# Patient Record
Sex: Male | Born: 1960 | Race: Black or African American | Hispanic: No | Marital: Married | Smoking: Current every day smoker
Health system: Southern US, Community
[De-identification: ages and names within clinical notes are randomized; demographics above are authoritative.]

## PROBLEM LIST (undated history)

## (undated) DIAGNOSIS — E785 Hyperlipidemia, unspecified: Secondary | ICD-10-CM

## (undated) DIAGNOSIS — I10 Essential (primary) hypertension: Secondary | ICD-10-CM

## (undated) HISTORY — DX: Essential (primary) hypertension: I10

## (undated) HISTORY — DX: Hyperlipidemia, unspecified: E78.5

---

## 2014-11-10 LAB — PSA: PSA: 0.56

## 2014-11-11 LAB — HEMOGLOBIN A1C: Hemoglobin A1C: 6.3

## 2017-02-01 LAB — CBC AND DIFFERENTIAL
HEMATOCRIT: 43 (ref 41–53)
HEMOGLOBIN: 13.9 (ref 13.5–17.5)
Neutrophils Absolute: 3
PLATELETS: 273 (ref 150–399)
WBC: 6.6

## 2017-02-01 LAB — BASIC METABOLIC PANEL
BUN: 24 — AB (ref 4–21)
Creatinine: 1.3 (ref <=1.3)
GLUCOSE: 109
POTASSIUM: 4.5 (ref 3.4–5.3)
Sodium: 140 (ref 137–147)

## 2017-02-01 LAB — HEPATIC FUNCTION PANEL
ALK PHOS: 69 (ref 25–125)
ALT: 31 (ref 10–40)
AST: 24 (ref 14–40)
Bilirubin, Total: 0.8

## 2017-02-01 LAB — LIPID PANEL
CHOLESTEROL: 179 (ref 0–200)
HDL: 44 (ref 35–70)
LDL Cholesterol: 113
Triglycerides: 112 (ref 40–160)

## 2017-02-06 LAB — CBC AND DIFFERENTIAL
HCT: 42 (ref 41–53)
Hemoglobin: 13.5 (ref 13.5–17.5)
Neutrophils Absolute: 2
Platelets: 256 (ref 150–399)
WBC: 4.8

## 2017-02-06 LAB — IRON AND TIBC
Iron Bind.Cap.(Total): 297
Iron: 69

## 2017-02-06 LAB — FERRITIN: FERRITIN: 47.3

## 2017-02-06 LAB — TSH: TSH: 0.42 (ref <=5.90)

## 2017-09-03 NOTE — Progress Notes (Addendum)
Subjective:    Patient ID: Mario Newman, male    DOB: 1960-12-05, 56 y.o.   MRN: [id number]  HPI:  Mario Newman is here to establish as a new pt.  He is very pleasant 56 year old male. PMH: HTN, Depression, glucose intolerance (A1c been running in low 6s, never been on anti-diabetic medications), and Tobacco Use.  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  He drinks 5 cups of coffee each morning and a Mnt Dew in the afternoon, he denies any plain water intake.  He eats only one meal a day-dinner and denies any exercise.  He smokes 3/4 pack/day and has been using tobacco > 20 years (with 3 yr hiatus, he started smoking again when his wife's healthy began to fail >2 yrs ago).  He denies any acute sx's, however would like to lose weight and start a regular exercise regime.  He is compliant on Lisinopril 10mg  daily and Citalopram 40mg  and denies med SE. He reports "decent sleep" with early waking several times/night.   Patient Care Team    Relationship Specialty Notifications Start End  Esaw Grandchild, NP PCP - General Family Medicine  08/06/17     Patient Active Problem List   Diagnosis Date Noted  . Essential hypertension 09/04/2017  . Healthcare maintenance 09/04/2017  . Tobacco use 09/04/2017  . Depression, recurrent ([place]) 09/04/2017     History reviewed. No pertinent past medical history.   History reviewed. No pertinent surgical history.   Family History  Problem Relation Age of Onset  . Hypertension Mother   . Diabetes Father   . Alcohol abuse Father   . Healthy Sister      Social History   Substance and Sexual Activity  Drug Use No     Social History   Substance and Sexual Activity  Alcohol Use No  . Frequency: Never     Social History   Tobacco Use  Smoking Status Current Every Day Smoker  . Packs/day: 1.00  . Years: 20.00  . Pack years: 20.00  . Types: Cigarettes  Smokeless  Tobacco Never Used     Outpatient Encounter Medications as of 09/04/2017  Medication Sig  . citalopram (CELEXA) 40 MG tablet Take 1 tablet by mouth daily.  Marland Kitchen lisinopril (PRINIVIL,ZESTRIL) 10 MG tablet Take 1 tablet by mouth daily.   No facility-administered encounter medications on file as of 09/04/2017.     Allergies: Patient has no known allergies.  Body mass index is 31.76 kg/m.  Blood pressure 108/72, pulse 72, height 5' 7.25" (1.708 m), weight 204 lb 4.8 oz (92.7 kg).      Review of Systems  Constitutional: Positive for fatigue. Negative for activity change, appetite change, chills, diaphoresis, fever and unexpected weight change.  Eyes: Negative for visual disturbance.  Respiratory: Negative for cough, chest tightness, shortness of breath, wheezing and stridor.   Cardiovascular: Negative for chest pain, palpitations and leg swelling.  Gastrointestinal: Negative for abdominal distention, abdominal pain, blood in stool, constipation, diarrhea, nausea and vomiting.  Endocrine: Negative for cold intolerance, heat intolerance, polydipsia, polyphagia and polyuria.  Genitourinary: Negative for difficulty urinating and flank pain.  Musculoskeletal: Negative for arthralgias, back pain, gait problem, joint swelling, myalgias, neck pain and neck stiffness.  Skin: Negative for color change, pallor, rash and wound.  Neurological: Negative for dizziness and headaches.  Hematological: Does not bruise/bleed easily.  Psychiatric/Behavioral: Positive for dysphoric mood and  sleep disturbance. Negative for decreased concentration, hallucinations, self-injury and suicidal ideas. The patient is not nervous/anxious and is not hyperactive.        Objective:   Physical Exam  Constitutional: He is oriented to person, place, and time. He appears well-developed and well-nourished. No distress.  HENT:  Head: Normocephalic and atraumatic.  Right Ear: External ear normal.  Left Ear: External ear  normal.  Cardiovascular: Normal rate, regular rhythm, normal heart sounds and intact distal pulses.  No murmur heard. Pulmonary/Chest: Effort normal and breath sounds normal. No respiratory distress. He has no wheezes. He has no rales. He exhibits no tenderness.  Neurological: He is alert and oriented to person, place, and time. Coordination normal.  Skin: Skin is warm and dry. No rash noted. He is not diaphoretic. No erythema. No pallor.  Psychiatric: He has a normal mood and affect. His behavior is normal. Judgment and thought content normal.  Nursing note and vitals reviewed.         Assessment & Plan:   1. Essential hypertension   2. Healthcare maintenance   3. Tobacco use   4. Depression, recurrent ([place])     Healthcare maintenance  Increase water intake, strive for at least 100 ounces/day.   Follow Heart Healthy diet Increase regular exercise.  Recommend at least 30 minutes daily, 5 days per week of walking, jogging, biking, swimming, YouTube/Pinterest workout videos. Please schedule complete physical with fasting labs in 2 months.  Essential hypertension BP at goal 108/72, HR 72 Continue daily Lisinopril 10mg  Reduce-stop tobacco use   Tobacco use Declined smoking cessation   Depression, recurrent (HCC) Stable on once daily Citalopram 40mg    FOLLOW-UP:  Return in about 2 months (around 11/04/2017) for CPE, Fasting Lab Draw.

## 2017-09-04 ENCOUNTER — Ambulatory Visit: Payer: Managed Care, Other (non HMO) | Admitting: Adult Health

## 2017-09-04 ENCOUNTER — Encounter: Payer: Self-pay | Admitting: Adult Health

## 2017-09-04 VITALS — BP 108/72 | HR 72 | Ht 67.25 in | Wt 204.3 lb

## 2017-09-04 DIAGNOSIS — R5383 Other fatigue: Secondary | ICD-10-CM | POA: Diagnosis not present

## 2017-09-04 DIAGNOSIS — Z0000 Encounter for general adult medical examination without abnormal findings: Secondary | ICD-10-CM | POA: Insufficient documentation

## 2017-09-04 DIAGNOSIS — I10 Essential (primary) hypertension: Secondary | ICD-10-CM | POA: Insufficient documentation

## 2017-09-04 DIAGNOSIS — R7302 Impaired glucose tolerance (oral): Secondary | ICD-10-CM | POA: Diagnosis not present

## 2017-09-04 DIAGNOSIS — Z720 Tobacco use: Secondary | ICD-10-CM

## 2017-09-04 DIAGNOSIS — F339 Major depressive disorder, recurrent, unspecified: Secondary | ICD-10-CM | POA: Diagnosis not present

## 2017-09-04 NOTE — Assessment & Plan Note (Signed)
Declined smoking cessation

## 2017-09-04 NOTE — Assessment & Plan Note (Signed)
BP at goal 108/72, HR 72 Continue daily Lisinopril 10mg  Reduce-stop tobacco use

## 2017-09-04 NOTE — Patient Instructions (Signed)
Heart-Healthy Eating Plan Many factors influence your heart health, including eating and exercise habits. Heart (coronary) risk increases with abnormal blood fat (lipid) levels. Heart-healthy meal planning includes limiting unhealthy fats, increasing healthy fats, and making other small dietary changes. This includes maintaining a healthy body weight to help keep lipid levels within a normal range. What is my plan? Your health care provider recommends that you:  Get no more than __25___% of the total calories in your daily diet from fat.  Limit your intake of saturated fat to less than __5____% of your total calories each day.  Limit the amount of cholesterol in your diet to less than __300__ mg per day.  What types of fat should I choose?  Choose healthy fats more often. Choose monounsaturated and polyunsaturated fats, such as olive oil and canola oil, flaxseeds, walnuts, almonds, and seeds.  Eat more omega-3 fats. Good choices include salmon, mackerel, sardines, tuna, flaxseed oil, and ground flaxseeds. Aim to eat fish at least two times each week.  Limit saturated fats. Saturated fats are primarily found in animal products, such as meats, butter, and cream. Plant sources of saturated fats include palm oil, palm kernel oil, and coconut oil.  Avoid foods with partially hydrogenated oils in them. These contain trans fats. Examples of foods that contain trans fats are stick margarine, some tub margarines, cookies, crackers, and other baked goods. What general guidelines do I need to follow?  Check food labels carefully to identify foods with trans fats or high amounts of saturated fat.  Fill one half of your plate with vegetables and green salads. Eat 4-5 servings of vegetables per day. A serving of vegetables equals 1 cup of raw leafy vegetables,  cup of raw or cooked cut-up vegetables, or  cup of vegetable juice.  Fill one fourth of your plate with whole grains. Look for the word "whole"  as the first word in the ingredient list.  Fill one fourth of your plate with lean protein foods.  Eat 4-5 servings of fruit per day. A serving of fruit equals one medium whole fruit,  cup of dried fruit,  cup of fresh, frozen, or canned fruit, or  cup of 100% fruit juice.  Eat more foods that contain soluble fiber. Examples of foods that contain this type of fiber are apples, broccoli, carrots, beans, peas, and barley. Aim to get 20-30 g of fiber per day.  Eat more home-cooked food and less restaurant, buffet, and fast food.  Limit or avoid alcohol.  Limit foods that are high in starch and sugar.  Avoid fried foods.  Cook foods by using methods other than frying. Baking, boiling, grilling, and broiling are all great options. Other fat-reducing suggestions include: ? Removing the skin from poultry. ? Removing all visible fats from meats. ? Skimming the fat off of stews, soups, and gravies before serving them. ? Steaming vegetables in water or broth.  Lose weight if you are overweight. Losing just 5-10% of your initial body weight can help your overall health and prevent diseases such as diabetes and heart disease.  Increase your consumption of nuts, legumes, and seeds to 4-5 servings per week. One serving of dried beans or legumes equals  cup after being cooked, one serving of nuts equals 1 ounces, and one serving of seeds equals  ounce or 1 tablespoon.  You may need to monitor your salt (sodium) intake, especially if you have high blood pressure. Talk with your health care provider or dietitian to get  more information about reducing sodium. What foods can I eat? Grains  Breads, including [place], white, pita, wheat, raisin, rye, oatmeal, and [place]. Tortillas that are neither fried nor made with lard or trans fat. Low-fat rolls, including hotdog and hamburger buns and English muffins. Biscuits. Muffins. Waffles. Pancakes. Light popcorn. Whole-grain cereals. Flatbread. Melba  toast. Pretzels. Breadsticks. Rusks. Low-fat snacks and crackers, including oyster, saltine, matzo, graham, animal, and rye. Rice and pasta, including brown rice and those that are made with whole wheat. Vegetables All vegetables. Fruits All fruits, but limit coconut. Meats and Other Protein Sources Lean, well-trimmed beef, veal, pork, and lamb. Chicken and [place] without skin. All fish and shellfish. Wild duck, rabbit, pheasant, and venison. Egg whites or low-cholesterol egg substitutes. Dried beans, peas, lentils, and tofu.Seeds and most nuts. Dairy Low-fat or nonfat cheeses, including ricotta, string, and mozzarella. Skim or 1% milk that is liquid, powdered, or evaporated. Buttermilk that is made with low-fat milk. Nonfat or low-fat yogurt. Beverages Mineral water. Diet carbonated beverages. Sweets and Desserts Sherbets and fruit ices. Honey, jam, marmalade, jelly, and syrups. Meringues and gelatins. Pure sugar candy, such as hard candy, jelly beans, gumdrops, mints, marshmallows, and small amounts of dark chocolate. [employer] Eat all sweets and desserts in moderation. Fats and Oils Nonhydrogenated (trans-free) margarines. Vegetable oils, including soybean, sesame, sunflower, olive, peanut, safflower, corn, canola, and cottonseed. Salad dressings or mayonnaise that are made with a vegetable oil. Limit added fats and oils that you use for cooking, baking, salads, and as spreads. Other Cocoa powder. Coffee and tea. All seasonings and condiments. The items listed above may not be a complete list of recommended foods or beverages. Contact your dietitian for more options. What foods are not recommended? Grains Breads that are made with saturated or trans fats, oils, or whole milk. Croissants. Butter rolls. Cheese breads. Sweet rolls. Donuts. Buttered popcorn. Chow mein noodles. High-fat crackers, such as cheese or butter crackers. Meats and Other Protein Sources Fatty meats, such as  hotdogs, short ribs, sausage, spareribs, bacon, ribeye roast or steak, and mutton. High-fat deli meats, such as salami and bologna. Caviar. Domestic duck and goose. Organ meats, such as kidney, liver, sweetbreads, brains, gizzard, chitterlings, and heart. Dairy Cream, sour cream, cream cheese, and creamed cottage cheese. Whole milk cheeses, including blue (bleu), Monterey Jack, [place], [place], American, [place], Swiss, [place], [place], and [place]. Whole or 2% milk that is liquid, evaporated, or condensed. Whole buttermilk. Cream sauce or high-fat cheese sauce. Yogurt that is made from whole milk. Beverages Regular sodas and drinks with added sugar. Sweets and Desserts Frosting. Pudding. Cookies. Cakes other than angel food cake. Candy that has milk chocolate or white chocolate, hydrogenated fat, butter, coconut, or unknown ingredients. Buttered syrups. Full-fat ice cream or ice cream drinks. Fats and Oils Gravy that has suet, meat fat, or shortening. Cocoa butter, hydrogenated oils, palm oil, coconut oil, palm kernel oil. These can often be found in baked products, candy, fried foods, nondairy creamers, and whipped toppings. Solid fats and shortenings, including bacon fat, salt pork, lard, and butter. Nondairy cream substitutes, such as coffee creamers and sour cream substitutes. Salad dressings that are made of unknown oils, cheese, or sour cream. The items listed above may not be a complete list of foods and beverages to avoid. Contact your dietitian for more information. This information is not intended to replace advice given to you by your health care provider. Make sure you discuss any questions you have with your health care  provider. Document Released: 07/04/2008 Document Revised: 04/14/2016 Document Reviewed: 03/19/2014 Elsevier Interactive Patient Education  2017 [employer].   Exercising to [employer] Exercising can help you to lose weight. In order to lose weight through exercise,  you need to do vigorous-intensity exercise. You can tell that you are exercising with vigorous intensity if you are breathing very hard and fast and cannot hold a conversation while exercising. Moderate-intensity exercise helps to maintain your current weight. You can tell that you are exercising at a moderate level if you have a higher heart rate and faster breathing, but you are still able to hold a conversation. How often should I exercise? Choose an activity that you enjoy and set realistic goals. Your health care provider can help you to make an activity plan that works for you. Exercise regularly as directed by your health care provider. This may include:  Doing resistance training twice each week, such as: ? Push-ups. ? Sit-ups. ? Lifting weights. ? Using resistance bands.  Doing a given intensity of exercise for a given amount of time. Choose from these options: ? 150 minutes of moderate-intensity exercise every week. ? 75 minutes of vigorous-intensity exercise every week. ? A mix of moderate-intensity and vigorous-intensity exercise every week.  Children, pregnant women, people who are out of shape, people who are overweight, and older adults may need to consult a health care provider for individual recommendations. If you have any sort of medical condition, be sure to consult your health care provider before starting a new exercise program. What are some activities that can help me to lose weight?  Walking at a rate of at least 4.5 miles an hour.  Jogging or running at a rate of 5 miles per hour.  Biking at a rate of at least 10 miles per hour.  Lap swimming.  Roller-skating or in-line skating.  Cross-country skiing.  Vigorous competitive sports, such as football, basketball, and soccer.  Jumping rope.  Aerobic dancing. How can I be more active in my day-to-day activities?  Use the stairs instead of the elevator.  Take a walk during your lunch break.  If you drive,  park your car farther away from work or school.  If you take public transportation, get off one stop early and walk the rest of the way.  Make all of your phone calls while standing up and walking around.  Get up, stretch, and walk around every 30 minutes throughout the day. What guidelines should I follow while exercising?  Do not exercise so much that you hurt yourself, feel dizzy, or get very short of breath.  Consult your health care provider prior to starting a new exercise program.  Wear comfortable clothes and shoes with good support.  Drink plenty of water while you exercise to prevent dehydration or heat stroke. Body water is lost during exercise and must be replaced.  Work out until you breathe faster and your heart beats faster. This information is not intended to replace advice given to you by your health care provider. Make sure you discuss any questions you have with your health care provider. Document Released: 10/28/2010 Document Revised: 03/02/2016 Document Reviewed: 02/26/2014 Elsevier Interactive Patient Education  2018 [employer].  Increase water intake, strive for at least 100 ounces/day.   Follow Heart Healthy diet Increase regular exercise.  Recommend at least 30 minutes daily, 5 days per week of walking, jogging, biking, swimming, YouTube/Pinterest workout videos. Please schedule complete physical with fasting labs in 2 months. WELCOME  TO THE PRACTICE!

## 2017-09-04 NOTE — Assessment & Plan Note (Signed)
  Increase water intake, strive for at least 100 ounces/day.   Follow Heart Healthy diet Increase regular exercise.  Recommend at least 30 minutes daily, 5 days per week of walking, jogging, biking, swimming, YouTube/Pinterest workout videos. Please schedule complete physical with fasting labs in 2 months.

## 2017-09-04 NOTE — Assessment & Plan Note (Signed)
Stable on once daily Citalopram 40mg 

## 2017-09-05 NOTE — Addendum Note (Signed)
Addended by: Mina Marble D on: 09/05/2017 03:55 PM   Modules accepted: Orders

## 2017-09-24 ENCOUNTER — Other Ambulatory Visit: Payer: Self-pay | Admitting: Adult Health

## 2017-09-24 MED ORDER — LISINOPRIL 10 MG PO TABS: 10.0000 mg | ORAL_TABLET | Freq: Every day | ORAL | 0 refills | Status: DC

## 2017-09-24 NOTE — Telephone Encounter (Signed)
We have not prescribed these medications for the patient previously.  Please review and refill if appropriate.  T. Mekia Dipinto, CMA  

## 2017-09-24 NOTE — Telephone Encounter (Signed)
°   Patient called states he is out of: Rx last prescribed by PCP in [place] (patient recently moved to [facility]) & chose Valetta Fuller D as his new PCP.  lisinopril (PRINIVIL,ZESTRIL) 10 MG tablet [[id number]]  Order Details  Dose: 1 tablet Route: Oral Frequency: Daily  Dispense Quantity: -- Refills: -- Fills remaining: --        Sig: Take 1 tablet by mouth daily.     ----- Patient wishes Rx to be sent to [facility] on [address].  --glh

## 2017-10-08 ENCOUNTER — Ambulatory Visit (INDEPENDENT_AMBULATORY_CARE_PROVIDER_SITE_OTHER): Payer: Managed Care, Other (non HMO)

## 2017-10-08 VITALS — BP 112/76 | HR 70 | Temp 98.5°F

## 2017-10-08 DIAGNOSIS — Z23 Encounter for immunization: Secondary | ICD-10-CM

## 2017-11-06 NOTE — Progress Notes (Signed)
Subjective:    Patient ID: Mario Newman, male    DOB: [birth year], 57 y.o.   MRN: [id number]  HPI: 09/04/17 OV:  Mario Newman is here to establish as a new pt.  He is very pleasant 57 year old male. PMH: HTN, Depression, glucose intolerance (A1c been running in low 6s, never been on anti-diabetic medications), and Tobacco Use.  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  He drinks 5 cups of coffee each morning and a Mnt Dew in the afternoon, he denies any plain water intake.  He eats only one meal a day-dinner and denies any exercise.  He smokes 3/4 pack/day and has been using tobacco > 20 years (with 3 yr hiatus, he started smoking again when his wife's healthy began to fail >2 yrs ago).  He denies any acute sx's, however would like to lose weight and start a regular exercise regime.  He is compliant on Lisinopril 10mg  daily and Citalopram 40mg  and denies med SE. He reports "decent sleep" with early waking several times/night.    11/08/17 OV: Mario Newman is here for CPE. Healthcare Maintenance: Colonoscopy-  Patient Care Team    Relationship Specialty Notifications Start End  Esaw Grandchild, NP PCP - General Family Medicine  08/06/17     Patient Active Problem List   Diagnosis Date Noted  . Essential hypertension 09/04/2017  . Healthcare maintenance 09/04/2017  . Tobacco use 09/04/2017  . Depression, recurrent ([place]) 09/04/2017     No past medical history on file.   No past surgical history on file.   Family History  Problem Relation Age of Onset  . Hypertension Mother   . Diabetes Father   . Alcohol abuse Father   . Healthy Sister      Social History   Substance and Sexual Activity  Drug Use No     Social History   Substance and Sexual Activity  Alcohol Use No  . Frequency: Never     Social History   Tobacco Use  Smoking Status Current Every Day Smoker  . Packs/day: 1.00  . Years:  20.00  . Pack years: 20.00  . Types: Cigarettes  Smokeless Tobacco Never Used     Outpatient Encounter Medications as of 11/08/2017  Medication Sig  . citalopram (CELEXA) 40 MG tablet Take 1 tablet by mouth daily.  Marland Kitchen lisinopril (PRINIVIL,ZESTRIL) 10 MG tablet Take 1 tablet (10 mg total) by mouth daily.   No facility-administered encounter medications on file as of 11/08/2017.     Allergies: Patient has no known allergies.  There is no height or weight on file to calculate BMI.  There were no vitals taken for this visit.      Review of Systems  Constitutional: Positive for fatigue. Negative for activity change, appetite change, chills, diaphoresis, fever and unexpected weight change.  Eyes: Negative for visual disturbance.  Respiratory: Negative for cough, chest tightness, shortness of breath, wheezing and stridor.   Cardiovascular: Negative for chest pain, palpitations and leg swelling.  Gastrointestinal: Negative for abdominal distention, abdominal pain, blood in stool, constipation, diarrhea, nausea and vomiting.  Endocrine: Negative for cold intolerance, heat intolerance, polydipsia, polyphagia and polyuria.  Genitourinary: Negative for difficulty urinating and flank pain.  Musculoskeletal: Negative for arthralgias, back pain, gait problem, joint swelling, myalgias, neck pain and neck stiffness.  Skin: Negative for color change, pallor, rash and wound.  Neurological: Negative for dizziness and headaches.  Hematological: Does not bruise/bleed easily.  Psychiatric/Behavioral: Positive for dysphoric mood and sleep disturbance. Negative for decreased concentration, hallucinations, self-injury and suicidal ideas. The patient is not nervous/anxious and is not hyperactive.        Objective:   Physical Exam  Constitutional: He is oriented to person, place, and time. He appears well-developed and well-nourished. No distress.  HENT:  Head: Normocephalic and atraumatic.  Right  Ear: External ear normal.  Left Ear: External ear normal.  Cardiovascular: Normal rate, regular rhythm, normal heart sounds and intact distal pulses.  No murmur heard. Pulmonary/Chest: Effort normal and breath sounds normal. No respiratory distress. He has no wheezes. He has no rales. He exhibits no tenderness.  Neurological: He is alert and oriented to person, place, and time. Coordination normal.  Skin: Skin is warm and dry. No rash noted. He is not diaphoretic. No erythema. No pallor.  Psychiatric: He has a normal mood and affect. His behavior is normal. Judgment and thought content normal.  Nursing note and vitals reviewed.         Assessment & Plan:   No diagnosis found.  No problem-specific Assessment & Plan notes found for this encounter.  FOLLOW-UP:  No Follow-up on file.

## 2017-11-08 ENCOUNTER — Other Ambulatory Visit (INDEPENDENT_AMBULATORY_CARE_PROVIDER_SITE_OTHER): Payer: Managed Care, Other (non HMO)

## 2017-11-08 DIAGNOSIS — R7302 Impaired glucose tolerance (oral): Secondary | ICD-10-CM

## 2017-11-08 DIAGNOSIS — Z0000 Encounter for general adult medical examination without abnormal findings: Secondary | ICD-10-CM

## 2017-11-08 DIAGNOSIS — R5383 Other fatigue: Secondary | ICD-10-CM | POA: Diagnosis not present

## 2017-11-09 LAB — COMPREHENSIVE METABOLIC PANEL
ALBUMIN: 4.2 g/dL (ref 3.5–5.5)
ALT: 23 IU/L (ref 0–44)
AST: 20 IU/L (ref 0–40)
Albumin/Globulin Ratio: 1.6 (ref 1.2–2.2)
Alkaline Phosphatase: 67 IU/L (ref 39–117)
BUN / CREAT RATIO: 10 (ref 9–20)
BUN: 12 mg/dL (ref 6–24)
Bilirubin Total: <0.2 mg/dL (ref 0.0–1.2)
CALCIUM: 9.2 mg/dL (ref 8.7–10.2)
CO2: 21 mmol/L (ref 20–29)
CREATININE: 1.22 mg/dL (ref 0.76–1.27)
Chloride: 106 mmol/L (ref 96–106)
GFR calc Af Amer: 76 mL/min/{1.73_m2} (ref >59)
GFR, EST NON AFRICAN AMERICAN: 66 mL/min/{1.73_m2} (ref >59)
GLOBULIN, TOTAL: 2.7 g/dL (ref 1.5–4.5)
Glucose: 109 mg/dL — ABNORMAL HIGH (ref 65–99)
Potassium: 4.8 mmol/L (ref 3.5–5.2)
Sodium: 140 mmol/L (ref 134–144)
TOTAL PROTEIN: 6.9 g/dL (ref 6.0–8.5)

## 2017-11-09 LAB — HEMOGLOBIN A1C
Est. average glucose Bld gHb Est-mCnc: 131 mg/dL
Hgb A1c MFr Bld: 6.2 % — ABNORMAL HIGH (ref 4.8–5.6)

## 2017-11-09 LAB — LIPID PANEL
Chol/HDL Ratio: 4.8 ratio (ref 0.0–5.0)
Cholesterol, Total: 176 mg/dL (ref 100–199)
HDL: 37 mg/dL — ABNORMAL LOW (ref >39)
LDL Calculated: 123 mg/dL — ABNORMAL HIGH (ref 0–99)
Triglycerides: 78 mg/dL (ref 0–149)
VLDL Cholesterol Cal: 16 mg/dL (ref 5–40)

## 2017-11-09 LAB — VITAMIN D 25 HYDROXY (VIT D DEFICIENCY, FRACTURES): Vit D, 25-Hydroxy: 45.2 ng/mL (ref 30.0–100.0)

## 2017-11-09 LAB — TSH: TSH: 0.67 u[IU]/mL (ref 0.450–4.500)

## 2017-11-15 NOTE — Progress Notes (Signed)
Subjective:    Patient ID: Mario Newman, male    DOB: 15-Oct-1960, 57 y.o.   MRN: [id number]  HPI: 09/04/17 OV:  [name] is here to establish as a new pt.  He is very pleasant 57 year old male. PMH: HTN, Depression, glucose intolerance (A1c been running in low 6s, never been on anti-diabetic medications), and Tobacco Use.  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  He drinks 5 cups of coffee each morning and a Mnt Dew in the afternoon, he denies any plain water intake.  He eats only one meal a day-dinner and denies any exercise.  He smokes 3/4 pack/day and has been using tobacco > 20 years (with 3 yr hiatus, he started smoking again when his wife's healthy began to fail >2 yrs ago).  He denies any acute sx's, however would like to lose weight and start a regular exercise regime.  He is compliant on Lisinopril 10mg  daily and Citalopram 40mg  and denies med SE. He reports "decent sleep" with early waking several times/night.    11/20/17 OV: Mr. Geise presents for CPE. The 10-year ASCVD risk score [name] DC [name]., et al., 2013) is: 26.2%   Values used to calculate the score:     Age: 57 years     Sex: Male     Is Non-Hispanic African American: Yes     Diabetic: No     Tobacco smoker: Yes     Systolic Blood Pressure: 846 mmHg     Is BP treated: Yes     HDL Cholesterol: 37 mg/dL     Total Cholesterol: 176 mg/dL  Recommended to start atorvastatin 20mg  nightly He continues to consume >8 cups coffee/day and smoking 2/3 pack/day, but open to cessation assistance. He feels that his depression is stable, denies thoughts of harming himself/others, declined CBT today. He has yet to start an exercise program, but is interested in stretching and push-up type based exercise. He is changing employers in 3 weeks, which will double his income and he is very excited about the move.  Healthcare Maintenance: Colonoscopy-completed  2018 Immunizations- declined today LDCT- 17 pack hx and is currently smoking pack/day , therefore not indicated   Patient Care Team    Relationship Specialty Notifications Start End  Esaw Grandchild, NP PCP - General Family Medicine  08/06/17     Patient Active Problem List   Diagnosis Date Noted  . Essential hypertension 09/04/2017  . Healthcare maintenance 09/04/2017  . Tobacco use 09/04/2017  . Depression, recurrent ([place]) 09/04/2017     History reviewed. No pertinent past medical history.   History reviewed. No pertinent surgical history.   Family History  Problem Relation Age of Onset  . Hypertension Mother   . Diabetes Father   . Alcohol abuse Father   . Healthy Sister      Social History   Substance and Sexual Activity  Drug Use No     Social History   Substance and Sexual Activity  Alcohol Use No  . Frequency: Never     Social History   Tobacco Use  Smoking Status Current Every Day Smoker  . Packs/day: 1.00  . Years: 20.00  . Pack years: 20.00  . Types: Cigarettes  Smokeless Tobacco Never Used     Outpatient Encounter Medications as of 11/20/2017  Medication Sig  . citalopram (CELEXA) 40 MG tablet Take 1 tablet by mouth daily.  Marland Kitchen  lisinopril (PRINIVIL,ZESTRIL) 10 MG tablet Take 1 tablet (10 mg total) by mouth daily.  Marland Kitchen atorvastatin (LIPITOR) 20 MG tablet Take 1 tablet (20 mg total) by mouth daily.  Marland Kitchen buPROPion (WELLBUTRIN SR) 150 MG 12 hr tablet First three days take one tab daily, then increase to twice daily. Set quit date in 1-2 weeks   No facility-administered encounter medications on file as of 11/20/2017.     Allergies: Patient has no known allergies.  Body mass index is 32.5 kg/m.  Blood pressure (!) 151/87, pulse 77, height 5' 7.24" (1.708 m), weight 209 lb (94.8 kg).  Review of Systems  Constitutional: Positive for fatigue. Negative for activity change, appetite change, chills, diaphoresis, fever and unexpected weight  change.  Eyes: Negative for visual disturbance.  Respiratory: Negative for cough, chest tightness, shortness of breath, wheezing and stridor.   Cardiovascular: Negative for chest pain, palpitations and leg swelling.  Gastrointestinal: Negative for abdominal distention, abdominal pain, blood in stool, constipation, diarrhea, nausea and vomiting.  Endocrine: Negative for cold intolerance, heat intolerance, polydipsia, polyphagia and polyuria.  Genitourinary: Negative for difficulty urinating and flank pain.  Musculoskeletal: Positive for myalgias. Negative for arthralgias, back pain, gait problem, joint swelling, neck pain and neck stiffness.  Skin: Negative for color change, pallor, rash and wound.  Neurological: Negative for dizziness and headaches.  Hematological: Does not bruise/bleed easily.  Psychiatric/Behavioral: Positive for dysphoric mood and sleep disturbance. Negative for decreased concentration, hallucinations, self-injury and suicidal ideas. The patient is not nervous/anxious and is not hyperactive.        Objective:   Physical Exam  Constitutional: He is oriented to person, place, and time. He appears well-developed and well-nourished. No distress.  HENT:  Head: Normocephalic and atraumatic.  Right Ear: Hearing, tympanic membrane, external ear and ear canal normal. Tympanic membrane is not erythematous and not bulging. No decreased hearing is noted.  Left Ear: Hearing, tympanic membrane, external ear and ear canal normal. Tympanic membrane is not erythematous and not bulging. No decreased hearing is noted.  Nose: Nose normal. Right sinus exhibits no maxillary sinus tenderness and no frontal sinus tenderness. Left sinus exhibits no maxillary sinus tenderness and no frontal sinus tenderness.  Mouth/Throat: Uvula is midline, oropharynx is clear and moist and mucous membranes are normal.  Eyes: Conjunctivae are normal. Pupils are equal, round, and reactive to light.  Neck: Normal  range of motion. Neck supple.  Cardiovascular: Normal rate, regular rhythm, normal heart sounds and intact distal pulses.  No murmur heard. Pulmonary/Chest: Effort normal and breath sounds normal. No respiratory distress. He has no wheezes. He has no rales. He exhibits no tenderness.  Abdominal: Soft. Bowel sounds are normal. He exhibits no distension and no mass. There is no tenderness. There is no rebound and no guarding.  Musculoskeletal: He exhibits tenderness.       Thoracic back: He exhibits tenderness and spasm.  Lymphadenopathy:    He has no cervical adenopathy.  Neurological: He is alert and oriented to person, place, and time. Coordination normal.  Skin: Skin is warm and dry. No rash noted. He is not diaphoretic. No erythema. No pallor.  Psychiatric: He has a normal mood and affect. His behavior is normal. Judgment and thought content normal.  Nursing note and vitals reviewed.         Assessment & Plan:   1. Tinnitus of both ears   2. Nocturia   3. Healthcare maintenance   4. Tobacco use   5. Depression, recurrent ([place])  6. Essential hypertension     Healthcare maintenance Please take all medications as directed. Increase water intake, strive for at least 100 ounces/day.   Follow Heart Healthy diet Increase regular exercise.  Recommend at least 30 minutes daily, 5 days per week of walking, jogging, biking, swimming, YouTube/Pinterest workout videos. Reduce caffeine intake. Set smoking quit date 1-2 weeks Urology and Audiology referrals placed. Return in 6 weeks for labs (liver function) and immunizations Return in 3 months for fasting labs and office visit.  Tobacco use Started on Wellbutrin 150mg  daily x 3 days then increase to BID Set quit date 1-2 weeks He denies thoughts of harming himself/others  Depression, recurrent ([place]) Stable, he denies thoughts of harming himself/others Taking Citalopram 40mg  QD Declined CBT  Essential hypertension BP above  goal 151/87  FOLLOW-UP:  Return in about 3 months (around 02/17/2018) for Regular Follow Up, HTN, Hypercholestermia, Fasting Labs.

## 2017-11-20 ENCOUNTER — Encounter: Payer: Self-pay | Admitting: Adult Health

## 2017-11-20 ENCOUNTER — Ambulatory Visit (INDEPENDENT_AMBULATORY_CARE_PROVIDER_SITE_OTHER): Payer: Managed Care, Other (non HMO) | Admitting: Adult Health

## 2017-11-20 VITALS — BP 151/87 | HR 77 | Ht 67.24 in | Wt 209.0 lb

## 2017-11-20 DIAGNOSIS — Z0000 Encounter for general adult medical examination without abnormal findings: Secondary | ICD-10-CM

## 2017-11-20 DIAGNOSIS — Z720 Tobacco use: Secondary | ICD-10-CM

## 2017-11-20 DIAGNOSIS — S39012A Strain of muscle, fascia and tendon of lower back, initial encounter: Secondary | ICD-10-CM | POA: Diagnosis not present

## 2017-11-20 DIAGNOSIS — R351 Nocturia: Secondary | ICD-10-CM | POA: Diagnosis not present

## 2017-11-20 DIAGNOSIS — H9313 Tinnitus, bilateral: Secondary | ICD-10-CM | POA: Diagnosis not present

## 2017-11-20 DIAGNOSIS — I10 Essential (primary) hypertension: Secondary | ICD-10-CM | POA: Diagnosis not present

## 2017-11-20 DIAGNOSIS — E785 Hyperlipidemia, unspecified: Secondary | ICD-10-CM | POA: Insufficient documentation

## 2017-11-20 DIAGNOSIS — F339 Major depressive disorder, recurrent, unspecified: Secondary | ICD-10-CM | POA: Diagnosis not present

## 2017-11-20 DIAGNOSIS — E7800 Pure hypercholesterolemia, unspecified: Secondary | ICD-10-CM

## 2017-11-20 MED ORDER — BUPROPION HCL ER (SR) 150 MG PO TB12: ORAL_TABLET | ORAL | 0 refills | Status: DC

## 2017-11-20 MED ORDER — ATORVASTATIN CALCIUM 20 MG PO TABS: 20.0000 mg | ORAL_TABLET | Freq: Every day | ORAL | 1 refills | Status: DC

## 2017-11-20 MED ORDER — CYCLOBENZAPRINE HCL 10 MG PO TABS: 10.0000 mg | ORAL_TABLET | Freq: Every day | ORAL | 0 refills | Status: DC

## 2017-11-20 NOTE — Assessment & Plan Note (Addendum)
BP above goal 151/87, HR 77 Currently on Lisinopril 10mg  QD He consumed "pot of coffee" and 6-8 cigarettes prior to OV. Advised to quit smoking and consume <2 cups of coffee/day

## 2017-11-20 NOTE — Assessment & Plan Note (Signed)
Stretching, heating pad, nightly Cyclobenzaprine PRN

## 2017-11-20 NOTE — Assessment & Plan Note (Signed)
Audiology referral placed.

## 2017-11-20 NOTE — Assessment & Plan Note (Signed)
Stable, he denies thoughts of harming himself/others Taking Citalopram 40mg  QD Declined CBT

## 2017-11-20 NOTE — Patient Instructions (Signed)
Heart-Healthy Eating Plan Many factors influence your heart health, including eating and exercise habits. Heart (coronary) risk increases with abnormal blood fat (lipid) levels. Heart-healthy meal planning includes limiting unhealthy fats, increasing healthy fats, and making other small dietary changes. This includes maintaining a healthy body weight to help keep lipid levels within a normal range. What is my plan? Your health care provider recommends that you:  Get no more than __25__% of the total calories in your daily diet from fat.  Limit your intake of saturated fat to less than __5__% of your total calories each day.  Limit the amount of cholesterol in your diet to less than __300__ mg per day.  What types of fat should I choose?  Choose healthy fats more often. Choose monounsaturated and polyunsaturated fats, such as olive oil and canola oil, flaxseeds, walnuts, almonds, and seeds.  Eat more omega-3 fats. Good choices include salmon, mackerel, sardines, tuna, flaxseed oil, and ground flaxseeds. Aim to eat fish at least two times each week.  Limit saturated fats. Saturated fats are primarily found in animal products, such as meats, butter, and cream. Plant sources of saturated fats include palm oil, palm kernel oil, and coconut oil.  Avoid foods with partially hydrogenated oils in them. These contain trans fats. Examples of foods that contain trans fats are stick margarine, some tub margarines, cookies, crackers, and other baked goods. What general guidelines do I need to follow?  Check food labels carefully to identify foods with trans fats or high amounts of saturated fat.  Fill one half of your plate with vegetables and green salads. Eat 4-5 servings of vegetables per day. A serving of vegetables equals 1 cup of raw leafy vegetables,  cup of raw or cooked cut-up vegetables, or  cup of vegetable juice.  Fill one fourth of your plate with whole grains. Look for the word "whole" as  the first word in the ingredient list.  Fill one fourth of your plate with lean protein foods.  Eat 4-5 servings of fruit per day. A serving of fruit equals one medium whole fruit,  cup of dried fruit,  cup of fresh, frozen, or canned fruit, or  cup of 100% fruit juice.  Eat more foods that contain soluble fiber. Examples of foods that contain this type of fiber are apples, broccoli, carrots, beans, peas, and barley. Aim to get 20-30 g of fiber per day.  Eat more home-cooked food and less restaurant, buffet, and fast food.  Limit or avoid alcohol.  Limit foods that are high in starch and sugar.  Avoid fried foods.  Cook foods by using methods other than frying. Baking, boiling, grilling, and broiling are all great options. Other fat-reducing suggestions include: ? Removing the skin from poultry. ? Removing all visible fats from meats. ? Skimming the fat off of stews, soups, and gravies before serving them. ? Steaming vegetables in water or broth.  Lose weight if you are overweight. Losing just 5-10% of your initial body weight can help your overall health and prevent diseases such as diabetes and heart disease.  Increase your consumption of nuts, legumes, and seeds to 4-5 servings per week. One serving of dried beans or legumes equals  cup after being cooked, one serving of nuts equals 1 ounces, and one serving of seeds equals  ounce or 1 tablespoon.  You may need to monitor your salt (sodium) intake, especially if you have high blood pressure. Talk with your health care provider or dietitian to get  more information about reducing sodium. What foods can I eat? Grains  Breads, including [place], white, pita, wheat, raisin, rye, oatmeal, and [place]. Tortillas that are neither fried nor made with lard or trans fat. Low-fat rolls, including hotdog and hamburger buns and English muffins. Biscuits. Muffins. Waffles. Pancakes. Light popcorn. Whole-grain cereals. Flatbread. Melba toast.  Pretzels. Breadsticks. Rusks. Low-fat snacks and crackers, including oyster, saltine, matzo, graham, animal, and rye. Rice and pasta, including brown rice and those that are made with whole wheat. Vegetables All vegetables. Fruits All fruits, but limit coconut. Meats and Other Protein Sources Lean, well-trimmed beef, veal, pork, and lamb. Chicken and [place] without skin. All fish and shellfish. Wild duck, rabbit, pheasant, and venison. Egg whites or low-cholesterol egg substitutes. Dried beans, peas, lentils, and tofu.Seeds and most nuts. Dairy Low-fat or nonfat cheeses, including ricotta, string, and mozzarella. Skim or 1% milk that is liquid, powdered, or evaporated. Buttermilk that is made with low-fat milk. Nonfat or low-fat yogurt. Beverages Mineral water. Diet carbonated beverages. Sweets and Desserts Sherbets and fruit ices. Honey, jam, marmalade, jelly, and syrups. Meringues and gelatins. Pure sugar candy, such as hard candy, jelly beans, gumdrops, mints, marshmallows, and small amounts of dark chocolate. [employer] Eat all sweets and desserts in moderation. Fats and Oils Nonhydrogenated (trans-free) margarines. Vegetable oils, including soybean, sesame, sunflower, olive, peanut, safflower, corn, canola, and cottonseed. Salad dressings or mayonnaise that are made with a vegetable oil. Limit added fats and oils that you use for cooking, baking, salads, and as spreads. Other Cocoa powder. Coffee and tea. All seasonings and condiments. The items listed above may not be a complete list of recommended foods or beverages. Contact your dietitian for more options. What foods are not recommended? Grains Breads that are made with saturated or trans fats, oils, or whole milk. Croissants. Butter rolls. Cheese breads. Sweet rolls. Donuts. Buttered popcorn. Chow mein noodles. High-fat crackers, such as cheese or butter crackers. Meats and Other Protein Sources Fatty meats, such as hotdogs,  short ribs, sausage, spareribs, bacon, ribeye roast or steak, and mutton. High-fat deli meats, such as salami and bologna. Caviar. Domestic duck and goose. Organ meats, such as kidney, liver, sweetbreads, brains, gizzard, chitterlings, and heart. Dairy Cream, sour cream, cream cheese, and creamed cottage cheese. Whole milk cheeses, including blue (bleu), Monterey Jack, [place], [place], American, [place], Swiss, [place], [place], and [place]. Whole or 2% milk that is liquid, evaporated, or condensed. Whole buttermilk. Cream sauce or high-fat cheese sauce. Yogurt that is made from whole milk. Beverages Regular sodas and drinks with added sugar. Sweets and Desserts Frosting. Pudding. Cookies. Cakes other than angel food cake. Candy that has milk chocolate or white chocolate, hydrogenated fat, butter, coconut, or unknown ingredients. Buttered syrups. Full-fat ice cream or ice cream drinks. Fats and Oils Gravy that has suet, meat fat, or shortening. Cocoa butter, hydrogenated oils, palm oil, coconut oil, palm kernel oil. These can often be found in baked products, candy, fried foods, nondairy creamers, and whipped toppings. Solid fats and shortenings, including bacon fat, salt pork, lard, and butter. Nondairy cream substitutes, such as coffee creamers and sour cream substitutes. Salad dressings that are made of unknown oils, cheese, or sour cream. The items listed above may not be a complete list of foods and beverages to avoid. Contact your dietitian for more information. This information is not intended to replace advice given to you by your health care provider. Make sure you discuss any questions you have with your health care  provider. Document Released: 07/04/2008 Document Revised: 04/14/2016 Document Reviewed: 03/19/2014 Elsevier Interactive Patient Education  2018 [employer].  Bupropion sustained-release tablets (smoking cessation) What is this medicine? BUPROPION (byoo PROE pee on) is used to  help people quit smoking. This medicine may be used for other purposes; ask your health care provider or pharmacist if you have questions. COMMON BRAND NAME(S): Buproban, Zyban What should I tell my health care provider before I take this medicine? They need to know if you have any of these conditions: -an eating disorder, such as anorexia or bulimia -bipolar disorder or psychosis -diabetes or high blood sugar, treated with medication -glaucoma -head injury or brain tumor -heart disease, previous heart attack, or irregular heart beat -high blood pressure -kidney or liver disease -seizures -suicidal thoughts or a previous suicide attempt -Tourette's syndrome -weight loss -an unusual or allergic reaction to bupropion, other medicines, foods, dyes, or preservatives -breast-feeding -pregnant or trying to become pregnant How should I use this medicine? Take this medicine by mouth with a glass of water. Follow the directions on the prescription label. You can take it with or without food. If it upsets your stomach, take it with food. Do not cut, crush or chew this medicine. Take your medicine at regular intervals. If you take this medicine more than once a day, take your second dose at least 8 hours after you take your first dose. To limit difficulty in sleeping, avoid taking this medicine at bedtime. Do not take your medicine more often than directed. Do not stop taking this medicine suddenly except upon the advice of your doctor. Stopping this medicine too quickly may cause serious side effects. A special MedGuide will be given to you by the pharmacist with each prescription and refill. Be sure to read this information carefully each time. Talk to your pediatrician regarding the use of this medicine in children. Special care may be needed. Overdosage: If you think you have taken too much of this medicine contact a poison control center or emergency room at once. NOTE: This medicine is only for  you. Do not share this medicine with others. What if I miss a dose? If you miss a dose, skip the missed dose and take your next tablet at the regular time. There should be at least 8 hours between doses. Do not take double or extra doses. What may interact with this medicine? Do not take this medicine with any of the following medications: -linezolid -MAOIs like Azilect, Carbex, Eldepryl, Marplan, Nardil, and Parnate -methylene blue (injected into a vein) -other medicines that contain bupropion like Wellbutrin This medicine may also interact with the following medications: -alcohol -certain medicines for anxiety or sleep -certain medicines for blood pressure like metoprolol, propranolol -certain medicines for depression or psychotic disturbances -certain medicines for HIV or AIDS like efavirenz, lopinavir, nelfinavir, ritonavir -certain medicines for irregular heart beat like propafenone, flecainide -certain medicines for Parkinson's disease like amantadine, levodopa -certain medicines for seizures like carbamazepine, phenytoin, phenobarbital -cimetidine -clopidogrel -cyclophosphamide -digoxin -furazolidone -isoniazid -nicotine -orphenadrine -procarbazine -steroid medicines like prednisone or cortisone -stimulant medicines for attention disorders, weight loss, or to stay awake -tamoxifen -theophylline -thiotepa -ticlopidine -tramadol -warfarin This list may not describe all possible interactions. Give your health care provider a list of all the medicines, herbs, non-prescription drugs, or dietary supplements you use. Also tell them if you smoke, drink alcohol, or use illegal drugs. Some items may interact with your medicine. What should I watch for while using this medicine? Visit your  doctor or health care professional for regular checks on your progress. This medicine should be used together with a patient support program. It is important to participate in a behavioral program,  counseling, or other support program that is recommended by your health care professional. Patients and their families should watch out for new or worsening thoughts of suicide or depression. Also watch out for sudden changes in feelings such as feeling anxious, agitated, panicky, irritable, hostile, aggressive, impulsive, severely restless, overly excited and hyperactive, or not being able to sleep. If this happens, especially at the beginning of treatment or after a change in dose, call your health care professional. Avoid alcoholic drinks while taking this medicine. Drinking excessive alcoholic beverages, using sleeping or anxiety medicines, or quickly stopping the use of these agents while taking this medicine may increase your risk for a seizure. Do not drive or use heavy machinery until you know how this medicine affects you. This medicine can impair your ability to perform these tasks. Do not take this medicine close to bedtime. It may prevent you from sleeping. Your mouth may get dry. Chewing sugarless gum or sucking hard candy, and drinking plenty of water may help. Contact your doctor if the problem does not go away or is severe. Do not use nicotine patches or chewing gum without the advice of your doctor or health care professional while taking this medicine. You may need to have your blood pressure taken regularly if your doctor recommends that you use both nicotine and this medicine together. What side effects may I notice from receiving this medicine? Side effects that you should report to your doctor or health care professional as soon as possible: -allergic reactions like skin rash, itching or hives, swelling of the face, lips, or tongue -breathing problems -changes in vision -confusion -elevated mood, decreased need for sleep, racing thoughts, impulsive behavior -fast or irregular heartbeat -hallucinations, loss of contact with reality -increased blood pressure -redness, blistering,  peeling or loosening of the skin, including inside the mouth -seizures -suicidal thoughts or other mood changes -unusually weak or tired -vomiting Side effects that usually do not require medical attention (report to your doctor or health care professional if they continue or are bothersome): -constipation -headache -loss of appetite -nausea -tremors -weight loss This list may not describe all possible side effects. Call your doctor for medical advice about side effects. You may report side effects to FDA at 1-800-FDA-1088. Where should I keep my medicine? Keep out of the reach of children. Store at room temperature between 20 and 25 degrees C (68 and 77 degrees F). Protect from light. Keep container tightly closed. Throw away any unused medicine after the expiration date. NOTE: This sheet is a summary. It may not cover all possible information. If you have questions about this medicine, talk to your doctor, pharmacist, or health care provider.  2018 Elsevier/Gold Standard (2016-03-17 13:49:28)  Please take all medications as directed. Increase water intake, strive for at least 100 ounces/day.   Follow Heart Healthy diet Increase regular exercise.  Recommend at least 30 minutes daily, 5 days per week of walking, jogging, biking, swimming, YouTube/Pinterest workout videos. Reduce caffeine intake. Set smoking quit date 1-2 weeks Urology and Audiology referrals placed. Return in 6 weeks for labs (liver function) and immunizations Return in 3 months for fasting labs and office visit. NICE TO SEE YOU!

## 2017-11-20 NOTE — Assessment & Plan Note (Signed)
Started on Wellbutrin 150mg  daily x 3 days then increase to BID Set quit date 1-2 weeks He denies thoughts of harming himself/others

## 2017-11-20 NOTE — Assessment & Plan Note (Signed)
Urology referral placed

## 2017-11-20 NOTE — Assessment & Plan Note (Signed)
Please take all medications as directed. Increase water intake, strive for at least 100 ounces/day.   Follow Heart Healthy diet Increase regular exercise.  Recommend at least 30 minutes daily, 5 days per week of walking, jogging, biking, swimming, YouTube/Pinterest workout videos. Reduce caffeine intake. Set smoking quit date 1-2 weeks Urology and Audiology referrals placed. Return in 6 weeks for labs (liver function) and immunizations Return in 3 months for fasting labs and office visit.

## 2017-11-20 NOTE — Assessment & Plan Note (Signed)
The 10-year ASCVD risk score [name] DC [name]., et al., 2013) is: 26.2%   Values used to calculate the score:     Age: 57 years     Sex: Male     Is Non-Hispanic African American: Yes     Diabetic: No     Tobacco smoker: Yes     Systolic Blood Pressure: 111 mmHg     Is BP treated: Yes     HDL Cholesterol: 37 mg/dL     Total Cholesterol: 176 mg/dL   LFTs normal Started on Atorvastatin 20mg  nightly LFT re-check 6 weeks, lipids 3 months

## 2017-12-31 ENCOUNTER — Other Ambulatory Visit: Payer: Managed Care, Other (non HMO)

## 2017-12-31 ENCOUNTER — Ambulatory Visit: Payer: Managed Care, Other (non HMO) | Admitting: Audiology

## 2018-02-15 ENCOUNTER — Other Ambulatory Visit: Payer: Self-pay | Admitting: Adult Health

## 2018-02-18 ENCOUNTER — Encounter: Payer: Self-pay | Admitting: Adult Health

## 2018-02-18 ENCOUNTER — Ambulatory Visit: Payer: Managed Care, Other (non HMO) | Admitting: Adult Health

## 2018-02-18 ENCOUNTER — Ambulatory Visit: Payer: Managed Care, Other (non HMO)

## 2018-02-18 VITALS — BP 111/74 | HR 81 | Ht 67.25 in | Wt 207.2 lb

## 2018-02-18 DIAGNOSIS — R079 Chest pain, unspecified: Secondary | ICD-10-CM

## 2018-02-18 DIAGNOSIS — I10 Essential (primary) hypertension: Secondary | ICD-10-CM | POA: Diagnosis not present

## 2018-02-18 DIAGNOSIS — R0600 Dyspnea, unspecified: Secondary | ICD-10-CM | POA: Diagnosis not present

## 2018-02-18 DIAGNOSIS — Z720 Tobacco use: Secondary | ICD-10-CM

## 2018-02-18 NOTE — Assessment & Plan Note (Signed)
CXR IMPRESSION: Chronic bronchitic-smoking related changes. No alveolar pneumonia nor CHF.

## 2018-02-18 NOTE — Progress Notes (Signed)
Subjective:    Patient ID: Mario Newman, male    DOB: 07/16/1961, 57 y.o.   MRN: [id number]  HPI:  Mario Newman presents with L trapezius pain that has been intermittent for 6 months that increased in severity and with new onset radiation to L hand the last 3 weeks. He descibes L trapezius pain as a "dull ache", rated 4/10 and has been occurring more frequently the last 2-3 days. He described L elbow "as a sharp pain" rate, 4/10 He also reports numbness of L fingers- 3/4/5 He denies recent neck injury but reports "sleeping funny" due to sinus congestion r/t seasonal allergies He reports poor sleep, with frequent waking and his wife reports "loud snoring with the occasional snort that will wake him". 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 He continue to smoke 5 cigarettes/day, has been using tobacco 25-30 years with a 3 yr quite period. He recently changed jobs in early March is under much more vocational pressure He reports sig fatigue for years He wakes at 0500 every am and consumes pot of coffee throughout the am He also reports increase noticeable increase in dyspnea over the last 6 months. He reports this is noticeable when walking up a flight of stairs ( He has EKG- NSR His wife is BS during OV  Patient Care Team    Relationship Specialty Notifications Start End  Esaw Grandchild, NP PCP - General Family Medicine  08/06/17     Patient Active Problem List   Diagnosis Date Noted  . Chest pain 02/18/2018  . Dyspnea 02/18/2018  . Nocturia 11/20/2017  . 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 Strain of back 11/20/2017  . Elevated LDL cholesterol level 11/20/2017  . Essential hypertension 09/04/2017  . Healthcare maintenance  09/04/2017  . Tobacco use 09/04/2017  . Depression, recurrent ([place]) 09/04/2017     History reviewed. No pertinent past medical history.   History reviewed. No pertinent surgical history.   Family History  Problem Relation Age of Onset  . Hypertension Mother   . Diabetes Father   . Alcohol abuse Father   . Healthy Sister      Social History   Substance and Sexual Activity  Drug Use No     Social History   Substance and Sexual Activity  Alcohol Use No  . Frequency: Never     Social History   Tobacco Use  Smoking Status Current Every Day Smoker  . Packs/day: 1.00  . Years: 20.00  . Pack years: 20.00  . Types: Cigarettes  Smokeless Tobacco Never Used     Outpatient Encounter Medications as of 02/18/2018  Medication Sig  . atorvastatin (LIPITOR) 20 MG tablet Take 1 tablet (20 mg total) by mouth daily.  Marland Kitchen buPROPion (WELLBUTRIN SR) 150 MG 12 hr tablet One tablet twice daily. NO FURTHER REFILLS.  PATIENT NEEDS OV  . citalopram (CELEXA) 40 MG tablet Take 1 tablet by mouth daily.  Marland Kitchen lisinopril (PRINIVIL,ZESTRIL) 10 MG tablet Take 1 tablet (10 mg total) by mouth daily.  . tamsulosin (FLOMAX) 0.4 MG CAPS capsule Take 11 capsules by mouth daily.  . [DISCONTINUED] cyclobenzaprine (FLEXERIL) 10 MG tablet Take 1 tablet (10 mg total) by mouth at bedtime.   No facility-administered encounter medications on file  as of 02/18/2018.     Allergies: Patient has no known allergies.  Body mass index is 32.21 kg/m.  Blood pressure 111/74, pulse 81, height 5' 7.25" (1.708 m), weight 207 lb 3.2 oz (94 kg), SpO2 95 %.  Review of Systems  Constitutional: Positive for activity change and fatigue. Negative for appetite change, chills, diaphoresis, fever and unexpected weight change.  HENT: Positive for congestion.   Respiratory: Positive for shortness of breath. Negative for cough, chest tightness, wheezing and stridor.   Cardiovascular: Negative for chest pain, palpitations and  leg swelling.  Neurological: Negative for dizziness and headaches.  Hematological: Does not bruise/bleed easily.  Psychiatric/Behavioral: Positive for sleep disturbance. The patient is nervous/anxious.       Objective:   Physical Exam  Constitutional: He is oriented to person, place, and time. He appears well-developed and well-nourished.  Non-toxic appearance. He does not appear ill. He appears distressed.  HENT:  Head: Normocephalic and atraumatic.  Eyes: Pupils are equal, round, and reactive to light. EOM are normal.  Neck: Normal range of motion. Neck supple.  Cardiovascular: Normal rate, intact distal pulses and normal pulses.  No extrasystoles are present.  No murmur heard. Pulmonary/Chest: Effort normal and breath sounds normal. He has no decreased breath sounds. He has no wheezes. He has no rales.  Neurological: He is alert and oriented to person, place, and time.  Psychiatric: His behavior is normal. His mood appears anxious. He is not agitated.      Assessment & Plan:   1. Chest pain, unspecified type   2. Dyspnea, unspecified type   3. Essential hypertension   4. Tobacco use     Chest pain EKG- NSR with anteroseptal infarct, age indetermined. No sig changes noted since 2009 EKG that is in system STAT ECHO ordered Imperative that he stop tobacco use and follow heart healthy diet He denies CP/Chest pressure He denies first degree family hx of CAD/MI He has been using tobacco >25 yrs   Essential hypertension BP at goal  111/74 HR 81 Continue on Lisinopril 10mg  QD   Tobacco use He declines smoking cessation Imperative that he stop all tobacco use  Dyspnea CXR IMPRESSION: Chronic bronchitic-smoking related changes. No alveolar pneumonia nor CHF.   FOLLOW-UP:  Return in about 3 months (around 05/21/2018) for Regular Follow Up.

## 2018-02-18 NOTE — Assessment & Plan Note (Signed)
He declines smoking cessation Imperative that he stop all tobacco use

## 2018-02-18 NOTE — Assessment & Plan Note (Signed)
BP at goal  111/74 HR 81 Continue on Lisinopril 10mg  QD

## 2018-02-18 NOTE — Patient Instructions (Signed)
Shortness of Breath, Adult Shortness of breath means you have trouble breathing. Your lungs are organs for breathing. Follow these instructions at home: Pay attention to any changes in your symptoms. Take these actions to help with your condition:  Do not smoke. Smoking can cause shortness of breath. If you need help to quit smoking, ask your doctor.  Avoid things that can make it harder to breathe, such as: ? Mold. ? Dust. ? Air pollution. ? Chemical smells. ? Things that can cause allergy symptoms (allergens), if you have allergies.  Keep your living space clean and free of mold and dust.  Rest as needed. Slowly return to your usual activities.  Take over-the-counter and prescription medicines, including oxygen and inhaled medicines, only as told by your doctor.  Keep all follow-up visits as told by your doctor. This is important.  Contact a doctor if:  Your condition does not get better as soon as expected.  You have a hard time doing your normal activities, even after you rest.  You have new symptoms. Get help right away if:  You have trouble breathing when you are resting.  You feel light-headed or you faint.  You have a cough that is not helped by medicines.  You cough up blood.  You have pain with breathing.  You have pain in your chest, arms, shoulders, or belly (abdomen).  You have a fever.  You cannot walk up stairs.  You cannot exercise the way you normally do. This information is not intended to replace advice given to you by your health care provider. Make sure you discuss any questions you have with your health care provider. Document Released: 03/13/2008 Document Revised: 10/12/2016 Document Reviewed: 10/12/2016 Elsevier Interactive Patient Education  2017 [place].  EKG- Stable Will order Stress Test, do not exert yourself until test completed. Increase plain water intake, follow heart healthy diet, and stop tobacco use. If Red Flag Symptoms  develop, ie chest pain, chest pressure, nausea, profuse sweating, significant shortness of breath- seek immediate medical care. Also recommend Sleep Study this summer. Follow-up in 4 weeks. FEEL BETTER!

## 2018-02-18 NOTE — Assessment & Plan Note (Addendum)
EKG- NSR with anteroseptal infarct, age indetermined. No sig changes noted since 2009 EKG that is in system STAT ECHO ordered Imperative that he stop tobacco use and follow heart healthy diet He denies CP/Chest pressure He denies first degree family hx of CAD/MI He has been using tobacco >25 yrs

## 2018-02-21 ENCOUNTER — Ambulatory Visit (HOSPITAL_COMMUNITY): Payer: Managed Care, Other (non HMO)

## 2018-02-21 ENCOUNTER — Telehealth (HOSPITAL_COMMUNITY): Payer: Self-pay

## 2018-02-21 ENCOUNTER — Ambulatory Visit (HOSPITAL_COMMUNITY): Payer: Managed Care, Other (non HMO) | Attending: Cardiovascular Disease

## 2018-02-21 DIAGNOSIS — I10 Essential (primary) hypertension: Secondary | ICD-10-CM | POA: Insufficient documentation

## 2018-02-21 DIAGNOSIS — R0600 Dyspnea, unspecified: Secondary | ICD-10-CM | POA: Insufficient documentation

## 2018-02-21 DIAGNOSIS — Z720 Tobacco use: Secondary | ICD-10-CM | POA: Diagnosis not present

## 2018-02-21 DIAGNOSIS — R079 Chest pain, unspecified: Secondary | ICD-10-CM | POA: Insufficient documentation

## 2018-02-21 MED ORDER — SODIUM CHLORIDE 0.9 % IV SOLN
40.0000 ug/kg/min | Freq: Once | INTRAVENOUS | Status: AC
  Administered 2018-02-21: 3760 ug/min via INTRAVENOUS

## 2018-02-21 NOTE — Telephone Encounter (Signed)
Attempted to contact [employer] again without success. Requesting to see if he is going to make his appointment today for his Dobutamine Stress ECHO. Asked him to call us back. S.Rowyn Spilde EMTP.

## 2018-02-21 NOTE — Telephone Encounter (Signed)
Attempted to contact Lynden Oxford without success, an answering machine message was left on his cell phone. Requested for him to call us back for further instructions. S.An Lannan EMTP

## 2018-03-17 ENCOUNTER — Other Ambulatory Visit: Payer: Self-pay | Admitting: Adult Health

## 2018-03-25 ENCOUNTER — Other Ambulatory Visit: Payer: Self-pay | Admitting: Adult Health

## 2018-03-26 ENCOUNTER — Ambulatory Visit: Payer: Managed Care, Other (non HMO) | Admitting: Adult Health

## 2018-04-01 ENCOUNTER — Ambulatory Visit: Payer: Managed Care, Other (non HMO) | Attending: Adult Health | Admitting: Audiology

## 2018-04-01 DIAGNOSIS — H9193 Unspecified hearing loss, bilateral: Secondary | ICD-10-CM

## 2018-04-01 DIAGNOSIS — H93299 Other abnormal auditory perceptions, unspecified ear: Secondary | ICD-10-CM

## 2018-04-01 DIAGNOSIS — H9313 Tinnitus, bilateral: Secondary | ICD-10-CM | POA: Diagnosis not present

## 2018-04-01 DIAGNOSIS — H906 Mixed conductive and sensorineural hearing loss, bilateral: Secondary | ICD-10-CM | POA: Diagnosis present

## 2018-04-01 NOTE — Procedures (Signed)
[facility] [place]  [place], [place] [address]  [id number]   Audiological Evaluation  Patient Name: Mario Newman   Status: Outpatient   DOB: April 04, 1961    Diagnosis: Tinnitus bilateral MRN: [id number] Date:  04/01/2018     Referent: Esaw Grandchild, NP  History: Mario Newman was seen for an audiological evaluation.  Primary Concern: Continuous ringng in ears for past 3-4 years with difficulty hearing - frequently asking, "please repeat".  Pain: None History of hearing problems: Y / N History of ear infections:  Y / N History of ear surgery or "tubes" : N History of dizziness/vertigo:   Y / N History of balance issues:  Y / N Tinnitus: On a scale of 1 (no problem) -10 (can't stand it) Mario Newman rates his tinnitus as "7". Sounds like a frequency - with one sound.  Sound sensitivity: N but finds himself "turning the volume down when he needs to concentrate".  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  History of hypertension: Y - "borderline but under control with medication" History of diabetes:  N Family history of hearing loss:  N Other concerns: N   Evaluation: Conventional pure tone audiometry from 250Hz  - 8000Hz  with using insert earphones.  Hearing Thresholds are mixed by bone conduction bilaterally. Air conduction hearing thresholds are: Right ear:  Thresholds of 30 dBHL at 250Hz ; 15-20 dBHL from 500Hz  - 2000Hz ; 25 dbHL at 3000hz ; 35 dBHL at 4000Hz  and 40-50dBHL from 6000Hz -8000Hz .  Left ear:    Thresholds of  10-20 dBHL from 250Hz  - 2000Hz ; 40 dBHL at 4000Hz  and 50-60 dBHL from 6000Hz  - 8000Hz . Reliability is good Speech reception levels (repeating words near threshold) using recorded spondee word lists:  Right ear: 15 dBHL.  Left ear:  20dBHL Word recognition (at comfortably loud volumes) using recorded NU-6 word lists at 55  dBHL, in quiet.  Right ear: 100%.  Left ear:   100% Word recognition using PBK word lists in minimal multitalker background noise:  +5 dBHL  Right ear: 76%                              Left ear:  50%  Tympanometry (middle ear function) shows normal middle ear volume, pressure and compliance bilaterally (Type A). Right ipsilateral acoustic reflexes are absent but the left acoustic reflexes were not obtained since a seal could not be maintained.    Tinnitus matching appeared to be approximately 3000Hz  using narrow band noise at 32dBHL and speech noise at 30 dBHL. The tinnitus did not appear to suppress.  CONCLUSION:      Mario Newman needs referral to an ENT. He has a mixed hearing loss bilaterally and reports that he feels "pressure" changes in his sinuses and ears when laying on one side of his head for a while and then turning to lay on the other side. In addition Mario Newman has bilateral tinnitus at approximately 3000Hz  at 30 dBHL bilaterally.    Mario Newman has normal hearing in the low frequencies that slopes in the high frequencies to a mild to moderate mixed hearing loss on the right and a mild to moderately severe mixed hearing loss on the left. He has slight recruitment bilaterally, more sensitive on the left side.  This amount of hearing loss would adversely affect speech communication at normal  conversational speech levels.   Word recognition is excellent in each ear  in quiet at conversational speech levels bilaterally. In minimal background noise, word recognition drops to fair on the right and poor on the left.  It is expected that Mario Newman will miss 25-50% of what is said in most social and office situations, possibly more with fluctuating background noise.      Of concern is that the tinnitus is very bothersome to Newman who rate is as a "7" on a scale of "1-10" (10 being severe). He reports that he hears the tinnitus all of the time and that it keeps him up at night.  The app  Mario Newman Relax was discussed for immediate use. In addition, Mario Newman needs evaluation by an ENT with a possible hearing aid/tinnitus evaluation. The test results were discussed and Mario Newman counseled. In preparation for the hearing aid evaluation Mario Newman was encouraged to contact his insurance company regarding hearing aid benefits and participating hearing aid providers as well as whether he is eligible for [facility] benefits.  RECOMMENDATIONS: 1.   Referral to an Ear, Nose and Throat physician for evaluation of a) bilateral tinnitus b) mixed hearing loss and c) absent acoustic reflexes on the right side (note the left acoustic reflexes could not be obtained today - poor seal). 2.   To minimize the adverse effects of tinnitus 1) avoid quiet  2) use noise maskers at home such as a sound machine, quiet music, a fan or other background noise at a volume just loud enough to mask the high pitched tinnitus. 3) If the tinnitus becomes more bothersome, adversely affecting your sleep or concentration, contact your physician,  seek additional medical help by an ENT for further treatment of your tinnitus. 3.   A hearing aid/tinnitus evaluation. In addition monitor hearing closely with a repeat audiological evaluation in 3-6 months to monitor hearing and tinnitus (earlier if there is any change in hearing or ear pressure).  This may be completed with an audiologist in private practice, at the ENT office or here. 4.  Strategies that help improve hearing include: A) Face the speaker directly. Optimal is having the speakers face well - lit.  Unless amplified, being within 3-6 feet of the speaker will enhance word recognition. B) Avoid having the speaker back-lit as this will minimize the ability to use cues from lip-reading, facial expression and gestures. C)  Word recognition is poorer in background noise. For optimal word recognition, turn off the TV, radio or noisy fan when engaging in conversation. In a  restaurant, try to sit away from noise sources and close to the primary speaker.  D)  Ask for topic clarification from time to time in order to remain in the conversation.  Most people don't mind repeating or clarifying a point when asked.  If needed, explain the difficulty hearing in background noise or hearing loss. 5.   Use hearing protection during noisy activities such as using a weed eater, moving the lawn, shooting, etc.    Musician's plugs, are available from [employer] for music related hearing protection because there is no distortion.  Other hearing protection, such as sponge plugs (available at pharmacies) or earmuffs (available at sporting goods stores or department stores such as Paediatric nurse) are useful for noisy activities and venues.  Mario Newman Spark, Au.D., CCC-A Doctor of Audiology 04/01/2018   cc: Esaw Grandchild, NP

## 2018-04-06 ENCOUNTER — Other Ambulatory Visit: Payer: Self-pay | Admitting: Adult Health

## 2018-04-17 ENCOUNTER — Telehealth: Payer: Self-pay | Admitting: Adult Health

## 2018-04-17 DIAGNOSIS — H9313 Tinnitus, bilateral: Secondary | ICD-10-CM

## 2018-04-17 NOTE — Addendum Note (Signed)
Addended by: Fonnie Mu on: 04/17/2018 03:22 PM   Modules accepted: Orders

## 2018-04-17 NOTE — Telephone Encounter (Signed)
Patient called asking about ENT referral. We sent him to audiology for eval and treatment and they recommended him f/u with an ENT and that PCP office would handle this. If this is true can I please get a referral placed for patient.

## 2018-05-11 ENCOUNTER — Other Ambulatory Visit: Payer: Self-pay | Admitting: Adult Health

## 2018-06-12 NOTE — Progress Notes (Signed)
Subjective:    Patient ID: Mario Newman, male    DOB: 1961/09/07, 57 y.o.   MRN: [id number]  HPI: 06/17/18 OV: Mario Newman is here for regular f/u: HTN, HLD, Depression, and Smoking cessation. He was previously on Bupropion 150mg  BID and was down to 2 cigarettes/day but then stopped rx. He has been off Bupropion for >2 months, currently smoking 10 cigarettes/ay. He has reduced caffeine intake, however rarely drinks plain water. He has bot been exercising but hopes to start a calisthenics regime this fall. He reports stress from work (recently changed jobs a few months ago) and minor anxiety about his son returning to college a few weeks ago. He denies thoughts of harming himself/others and reports a good support system of family and friends. He denies CP/dyspnea/dizziness/HA/palpitations.  Of Note- 02/21/18- Dobutamine Stress Echo: Impressions: This is intrepreted as a negative Dobutamine stress echo. No evidence of ischemia . Normal LV function  Patient Care Team    Relationship Specialty Notifications Start End  Mario Grandchild, NP PCP - General Family Medicine  08/06/17     Patient Active Problem List   Diagnosis Date Noted  . Chest pain 02/18/2018  . Dyspnea 02/18/2018  . Nocturia 11/20/2017  . 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 Strain of back 11/20/2017  . Elevated LDL cholesterol level 11/20/2017  . Essential hypertension 09/04/2017  . Healthcare maintenance 09/04/2017  . Tobacco use 09/04/2017  . Depression, recurrent ([place]) 09/04/2017     History reviewed. No pertinent past medical history.   History reviewed. No pertinent surgical history.   Family History  Problem Relation Age of Onset  . Hypertension Mother   . Diabetes Father   . Alcohol abuse Father   . Healthy Sister      Social History   Substance and Sexual Activity  Drug Use No     Social History   Substance and Sexual Activity  Alcohol Use No  . Frequency: Never     Social  History   Tobacco Use  Smoking Status Current Every Day Smoker  . Packs/day: 1.00  . Years: 20.00  . Pack years: 20.00  . Types: Cigarettes  Smokeless Tobacco Never Used     Outpatient Encounter Medications as of 06/17/2018  Medication Sig  . atorvastatin (LIPITOR) 20 MG tablet Take 1 tablet (20 mg total) by mouth daily. PATIENT MUST HAVE OFFICE VISIT PRIOR TO ANY FURTHER REFILLS  . citalopram (CELEXA) 40 MG tablet Take 1 tablet by mouth daily.  Marland Kitchen lisinopril (PRINIVIL,ZESTRIL) 10 MG tablet TAKE 1 TABLET BY MOUTH WITH BREAKFAST EVERY DAY  . buPROPion (WELLBUTRIN SR) 150 MG 12 hr tablet First three days 1 tab daily, then increase to 1 tab twice daily- hold at this dose.  . [DISCONTINUED] buPROPion (WELLBUTRIN SR) 150 MG 12 hr tablet TAKE ONE TABLET BY MOUTH TWICE DAILY. NO FURTHER REFILLS. PATIENT NEEDS OV (Patient not taking: Reported on 06/17/2018)  . [DISCONTINUED] tamsulosin (FLOMAX) 0.4 MG CAPS capsule Take 11 capsules by mouth daily.   No facility-administered encounter medications on file as of 06/17/2018.     Allergies: Patient has no known allergies.  Body mass index is 32.6 kg/m.  Blood pressure 120/75, pulse 84, height 5' 7.25" (1.708 m), weight 209 lb 11.2 oz (95.1 kg), SpO2 94 %.  Review of Systems  Constitutional: Positive for fatigue. Negative for activity change, appetite change, chills, diaphoresis, fever and unexpected weight change.  Eyes: Negative for visual disturbance.  Respiratory: Negative for cough, chest  tightness, shortness of breath, wheezing and stridor.   Cardiovascular: Negative for chest pain, palpitations and leg swelling.  Gastrointestinal: Negative for abdominal distention, abdominal pain, blood in stool, constipation, diarrhea, nausea and vomiting.  Endocrine: Negative for cold intolerance, heat intolerance, polydipsia, polyphagia and polyuria.  Genitourinary: Negative for difficulty urinating and flank pain.  Musculoskeletal: Positive for myalgias.  Negative for arthralgias, back pain, gait problem, joint swelling, neck pain and neck stiffness.  Skin: Negative for color change, pallor, rash and wound.  Neurological: Negative for dizziness and headaches.  Hematological: Does not bruise/bleed easily.  Psychiatric/Behavioral: Positive for dysphoric mood and sleep disturbance. Negative for decreased concentration, hallucinations, self-injury and suicidal ideas. The patient is not nervous/anxious and is not hyperactive.        Objective:   Physical Exam  Constitutional: He is oriented to person, place, and time. He appears well-developed and well-nourished. No distress.  HENT:  Head: Normocephalic and atraumatic.  Right Ear: Tympanic membrane, external ear and ear canal normal. Decreased hearing is noted.  Left Ear: External ear and ear canal normal. Tympanic membrane is not erythematous. Decreased hearing is noted.  Nose: Nose normal.  Eyes: Pupils are equal, round, and reactive to light. Conjunctivae and EOM are normal.  Cardiovascular: Normal rate, regular rhythm, normal heart sounds and intact distal pulses.  No murmur heard. Pulmonary/Chest: Effort normal and breath sounds normal. No respiratory distress. He has no wheezes. He has no rales. He exhibits no tenderness.  Abdominal: Soft. Bowel sounds are normal. He exhibits no distension and no mass. There is no tenderness. There is no rebound and no guarding.  Musculoskeletal:       Thoracic back: He exhibits no spasm.  Neurological: He is alert and oriented to person, place, and time.  Skin: Skin is warm and dry. No rash noted. He is not diaphoretic. No erythema. No pallor.  Psychiatric: He has a normal mood and affect. His behavior is normal. Judgment and thought content normal.  Nursing note and vitals reviewed.     Assessment & Plan:   1. Essential hypertension   2. Tobacco use   3. Healthcare maintenance     Essential hypertension BP at goal 120/75, HR 84 Continue  Lisinopril 10mg  QD  Healthcare maintenance Continue all medications as directed, re-started on Wellbutrin. Increase water intake, strive for at least75 ounces/day.  Follow Mediterranean diet Increase regular exercise.  Recommend at least 30 minutes daily, 5 days per week of walking, jogging, biking, swimming, YouTube/Pinterest workout videos. Follow-up in 6 months for complete physical, schedule fasting lab appt about a week before the physical.  Tobacco use Continue all medications as directed, re-started on Wellbutrin 150mg .  1 tab QD for 3 days then increase to BID Currently smoking 10 cigarettes/day   FOLLOW-UP:  Return in about 6 months (around 12/16/2018) for CPE, Fasting Labs.

## 2018-06-16 ENCOUNTER — Other Ambulatory Visit: Payer: Self-pay | Admitting: Family Medicine

## 2018-06-17 ENCOUNTER — Encounter: Payer: Self-pay | Admitting: Adult Health

## 2018-06-17 ENCOUNTER — Ambulatory Visit (INDEPENDENT_AMBULATORY_CARE_PROVIDER_SITE_OTHER): Payer: Managed Care, Other (non HMO) | Admitting: Adult Health

## 2018-06-17 VITALS — BP 120/75 | HR 84 | Ht 67.25 in | Wt 209.7 lb

## 2018-06-17 DIAGNOSIS — I10 Essential (primary) hypertension: Secondary | ICD-10-CM | POA: Diagnosis not present

## 2018-06-17 DIAGNOSIS — Z0000 Encounter for general adult medical examination without abnormal findings: Secondary | ICD-10-CM | POA: Diagnosis not present

## 2018-06-17 DIAGNOSIS — Z720 Tobacco use: Secondary | ICD-10-CM | POA: Diagnosis not present

## 2018-06-17 MED ORDER — BUPROPION HCL ER (SR) 150 MG PO TB12: ORAL_TABLET | ORAL | 1 refills | Status: DC

## 2018-06-17 NOTE — Assessment & Plan Note (Signed)
Continue all medications as directed, re-started on Wellbutrin. Increase water intake, strive for at least75 ounces/day.  Follow Mediterranean diet Increase regular exercise.  Recommend at least 30 minutes daily, 5 days per week of walking, jogging, biking, swimming, YouTube/Pinterest workout videos. Follow-up in 6 months for complete physical, schedule fasting lab appt about a week before the physical.

## 2018-06-17 NOTE — Assessment & Plan Note (Signed)
BP at goal 120/75, HR 84 Continue Lisinopril 10mg  QD

## 2018-06-17 NOTE — Assessment & Plan Note (Signed)
Continue all medications as directed, re-started on Wellbutrin 150mg .  1 tab QD for 3 days then increase to BID Currently smoking 10 cigarettes/day

## 2018-06-17 NOTE — Patient Instructions (Addendum)
Mediterranean Diet A Mediterranean diet refers to food and lifestyle choices that are based on the traditions of countries located on the [place]. This way of eating has been shown to help prevent certain conditions and improve outcomes for people who have chronic diseases, like kidney disease and heart disease. What are tips for following this plan? Lifestyle  Cook and eat meals together with your family, when possible.  Drink enough fluid to keep your urine clear or pale yellow.  Be physically active every day. This includes: ? Aerobic exercise like running or swimming. ? Leisure activities like gardening, walking, or housework.  Get 7-8 hours of sleep each night.  If recommended by your health care provider, drink red wine in moderation. This means 1 glass a day for nonpregnant women and 2 glasses a day for men. A glass of wine equals 5 oz (150 mL). Reading food labels  Check the serving size of packaged foods. For foods such as rice and pasta, the serving size refers to the amount of cooked product, not dry.  Check the total fat in packaged foods. Avoid foods that have saturated fat or trans fats.  Check the ingredients list for added sugars, such as corn syrup. Shopping  At the grocery store, buy most of your food from the areas near the walls of the store. This includes: ? Fresh fruits and vegetables (produce). ? Grains, beans, nuts, and seeds. Some of these may be available in unpackaged forms or large amounts (in bulk). ? Fresh seafood. ? Poultry and eggs. ? Low-fat dairy products.  Buy whole ingredients instead of prepackaged foods.  Buy fresh fruits and vegetables in-season from local farmers markets.  Buy frozen fruits and vegetables in resealable bags.  If you do not have access to quality fresh seafood, buy precooked frozen shrimp or canned fish, such as tuna, salmon, or sardines.  Buy small amounts of raw or cooked vegetables, salads, or olives from the  deli or salad bar at your store.  Stock your pantry so you always have certain foods on hand, such as olive oil, canned tuna, canned tomatoes, rice, pasta, and beans. Cooking  Cook foods with extra-virgin olive oil instead of using butter or other vegetable oils.  Have meat as a side dish, and have vegetables or grains as your main dish. This means having meat in small portions or adding small amounts of meat to foods like pasta or stew.  Use beans or vegetables instead of meat in common dishes like chili or lasagna.  Experiment with different cooking methods. Try roasting or broiling vegetables instead of steaming or sauteing them.  Add frozen vegetables to soups, stews, pasta, or rice.  Add nuts or seeds for added healthy fat at each meal. You can add these to yogurt, salads, or vegetable dishes.  Marinate fish or vegetables using olive oil, lemon juice, garlic, and fresh herbs. Meal planning  Plan to eat 1 vegetarian meal one day each week. Try to work up to 2 vegetarian meals, if possible.  Eat seafood 2 or more times a week.  Have healthy snacks readily available, such as: ? Vegetable sticks with hummus. ? Greek yogurt. ? Fruit and nut trail mix.  Eat balanced meals throughout the week. This includes: ? Fruit: 2-3 servings a day ? Vegetables: 4-5 servings a day ? Low-fat dairy: 2 servings a day ? Fish, poultry, or lean meat: 1 serving a day ? Beans and legumes: 2 or more servings a week ? Nuts   and seeds: 1-2 servings a day ? Whole grains: 6-8 servings a day ? Extra-virgin olive oil: 3-4 servings a day  Limit red meat and sweets to only a few servings a month What are my food choices?  Mediterranean diet ? Recommended ? Grains: Whole-grain pasta. Brown rice. Bulgar wheat. Polenta. Couscous. Whole-wheat bread. [place]. ? Vegetables: Artichokes. Beets. Broccoli. Cabbage. Carrots. Eggplant. Green beans. Chard. Kale. Spinach. Onions. Leeks. Peas. Squash.  Tomatoes. Peppers. Radishes. ? Fruits: Apples. Apricots. Avocado. Berries. Bananas. Cherries. Dates. Figs. Grapes. Lemons. Melon. Oranges. Peaches. Plums. Pomegranate. ? Meats and other protein foods: Beans. Almonds. Sunflower seeds. Pine nuts. Peanuts. [place]. Salmon. Scallops. Shrimp. [place]. Tilapia. Clams. Oysters. Eggs. ? Dairy: Low-fat milk. Cheese. Greek yogurt. ? Beverages: Water. Red wine. Herbal tea. ? Fats and oils: Extra virgin olive oil. Avocado oil. Grape seed oil. ? Sweets and desserts: [place] yogurt with honey. Baked apples. Poached pears. Trail mix. ? Seasoning and other foods: Basil. Cilantro. Coriander. Cumin. Mint. Parsley. Sage. Rosemary. Tarragon. Garlic. Oregano. Thyme. Pepper. Balsalmic vinegar. Tahini. Hummus. Tomato sauce. Olives. Mushrooms. ? Limit these ? Grains: Prepackaged pasta or rice dishes. Prepackaged cereal with added sugar. ? Vegetables: Deep fried potatoes (french fries). ? Fruits: Fruit canned in syrup. ? Meats and other protein foods: Beef. Pork. Lamb. Poultry with skin. Hot dogs. [place] Sal[place]. ? Dairy: Ice cream. Sour cream. Whole milk. ? Beverages: Juice. Sugar-sweetened soft drinks. Beer. Liquor and spirits. ? Fats and oils: Butter. Canola oil. Vegetable oil. Beef fat (tallow). Lard. ? Sweets and desserts: Cookies. Cakes. Pies. Candy. ? Seasoning and other foods: Mayonnaise. Premade sauces and marinades. ? The items listed may not be a complete list. Talk with your dietitian about what dietary choices are right for you. Summary  The Mediterranean diet includes both food and lifestyle choices.  Eat a variety of fresh fruits and vegetables, beans, nuts, seeds, and whole grains.  Limit the amount of red meat and sweets that you eat.  Talk with your health care provider about whether it is safe for you to drink red wine in moderation. This means 1 glass a day for nonpregnant women and 2 glasses a day for men. A glass of wine equals 5 oz (150 mL). This information  is not intended to replace advice given to you by your health care provider. Make sure you discuss any questions you have with your health care provider. Document Released: 05/18/2016 Document Revised: 06/20/2016 Document Reviewed: 05/18/2016 Elsevier Interactive Patient Education  2018 [place] all medications as directed, re-started on Wellbutrin. Increase water intake, strive for at least75 ounces/day.   Follow Mediterranean diet Increase regular exercise.  Recommend at least 30 minutes daily, 5 days per week of walking, jogging, biking, swimming, YouTube/Pinterest workout videos. Follow-up in 6 months for complete physical, schedule fasting lab appt about a week before the physical. NICE TO SEE YOU!

## 2018-08-18 ENCOUNTER — Other Ambulatory Visit: Payer: Self-pay | Admitting: Adult Health

## 2018-11-12 ENCOUNTER — Other Ambulatory Visit: Payer: Self-pay | Admitting: Adult Health

## 2018-12-02 ENCOUNTER — Encounter: Payer: Self-pay | Admitting: Adult Health

## 2018-12-03 ENCOUNTER — Other Ambulatory Visit: Payer: Managed Care, Other (non HMO)

## 2018-12-03 ENCOUNTER — Other Ambulatory Visit: Payer: Self-pay | Admitting: Adult Health

## 2018-12-03 ENCOUNTER — Other Ambulatory Visit: Payer: Self-pay

## 2018-12-03 DIAGNOSIS — Z0000 Encounter for general adult medical examination without abnormal findings: Secondary | ICD-10-CM

## 2018-12-03 DIAGNOSIS — E7800 Pure hypercholesterolemia, unspecified: Secondary | ICD-10-CM

## 2018-12-03 DIAGNOSIS — I10 Essential (primary) hypertension: Secondary | ICD-10-CM

## 2018-12-04 LAB — HEMOGLOBIN A1C
Est. average glucose Bld gHb Est-mCnc: 131 mg/dL
HEMOGLOBIN A1C: 6.2 % — AB (ref 4.8–5.6)

## 2018-12-04 LAB — COMPREHENSIVE METABOLIC PANEL
ALT: 33 IU/L (ref 0–44)
AST: 27 IU/L (ref 0–40)
Albumin/Globulin Ratio: 1.8 (ref 1.2–2.2)
Albumin: 4.4 g/dL (ref 3.8–4.9)
Alkaline Phosphatase: 66 IU/L (ref 39–117)
BILIRUBIN TOTAL: 0.3 mg/dL (ref 0.0–1.2)
BUN/Creatinine Ratio: 13 (ref 9–20)
BUN: 15 mg/dL (ref 6–24)
CHLORIDE: 106 mmol/L (ref 96–106)
CO2: 22 mmol/L (ref 20–29)
Calcium: 9.3 mg/dL (ref 8.7–10.2)
Creatinine, Ser: 1.17 mg/dL (ref 0.76–1.27)
GFR calc Af Amer: 80 mL/min/{1.73_m2} (ref >59)
GFR calc non Af Amer: 69 mL/min/{1.73_m2} (ref >59)
Globulin, Total: 2.4 g/dL (ref 1.5–4.5)
Glucose: 105 mg/dL — ABNORMAL HIGH (ref 65–99)
Potassium: 4.9 mmol/L (ref 3.5–5.2)
Sodium: 141 mmol/L (ref 134–144)
Total Protein: 6.8 g/dL (ref 6.0–8.5)

## 2018-12-04 LAB — LIPID PANEL
CHOL/HDL RATIO: 3.4 ratio (ref 0.0–5.0)
Cholesterol, Total: 135 mg/dL (ref 100–199)
HDL: 40 mg/dL (ref >39)
LDL Calculated: 79 mg/dL (ref 0–99)
Triglycerides: 81 mg/dL (ref 0–149)
VLDL Cholesterol Cal: 16 mg/dL (ref 5–40)

## 2018-12-04 LAB — CBC WITH DIFFERENTIAL/PLATELET
Basophils Absolute: 0 10*3/uL (ref 0.0–0.2)
Basos: 1 %
EOS (ABSOLUTE): 0.2 10*3/uL (ref 0.0–0.4)
Eos: 2 %
Hematocrit: 42.8 % (ref 37.5–51.0)
Hemoglobin: 14 g/dL (ref 13.0–17.7)
Immature Grans (Abs): 0 10*3/uL (ref 0.0–0.1)
Immature Granulocytes: 0 %
Lymphocytes Absolute: 2.5 10*3/uL (ref 0.7–3.1)
Lymphs: 37 %
MCH: 24.6 pg — ABNORMAL LOW (ref 26.6–33.0)
MCHC: 32.7 g/dL (ref 31.5–35.7)
MCV: 75 fL — ABNORMAL LOW (ref 79–97)
MONOS ABS: 0.7 10*3/uL (ref 0.1–0.9)
Monocytes: 11 %
Neutrophils Absolute: 3.3 10*3/uL (ref 1.4–7.0)
Neutrophils: 49 %
Platelets: 304 10*3/uL (ref 150–450)
RBC: 5.69 x10E6/uL (ref 4.14–5.80)
RDW: 15.7 % — ABNORMAL HIGH (ref 11.6–15.4)
WBC: 6.6 10*3/uL (ref 3.4–10.8)

## 2018-12-04 LAB — TSH: TSH: 0.654 u[IU]/mL (ref 0.450–4.500)

## 2018-12-13 NOTE — Progress Notes (Signed)
Subjective:    Patient ID: Mario Newman, male    DOB: [birth year], 58 y.o.   MRN: [id number]  HPI:  Mario Newman is here for CPE  Reviewed Recent Lab Results CBC- Slightly low MCV, MCH, RDW Poss Iron Def, B12 Def, Folate Def CMP-Stable Lipid Panel-  The 10-year ASCVD risk score Mario Bussing DC Jr., et al., 2013) is: 16.9% Values used to calculate the score:  Age: 79 years  Sex: Male  Is Non-Hispanic African American: Yes  Diabetic: No  Tobacco smoker: Yes  Systolic Blood Pressure: 242 mmHg  Is BP treated: Yes  HDL Cholesterol: 40 mg/dL  Total Cholesterol: 135 mg/dL LDL-79 Rec increasing atorvastatin dosage to lower ASCVD risk Remains in pre-diabetes ranges A1c- 6.2- needs to really focus on improving diet, quitting tobacco, and increasing exercise  TSH-WNL, 0.654  Healthcare Maintenance: Colonoscopy-UTD AAA Screening-Ordered Immunizations-Influenza and Tdap given today  Patient Care Team    Relationship Specialty Notifications Start End  Mario Grandchild, NP PCP - General Family Medicine  08/06/17     Patient Active Problem List   Diagnosis Date Noted  . Chest pain 02/18/2018  . Dyspnea 02/18/2018  . Nocturia 11/20/2017  . 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 Strain of back 11/20/2017  . Elevated LDL cholesterol level 11/20/2017  . Essential hypertension 09/04/2017  . Healthcare maintenance 09/04/2017  . Tobacco use 09/04/2017  . Depression, recurrent ([place]) 09/04/2017     History reviewed. No pertinent past medical history.   History reviewed. No pertinent surgical history.   Family History  Problem Relation Age of Onset  . Hypertension Mother   . Diabetes Father   . Alcohol abuse Father   . Healthy Sister      Social History   Substance and Sexual Activity  Drug Use No     Social History   Substance and Sexual Activity  Alcohol Use No  . Frequency: Never     Social History   Tobacco Use  Smoking Status  Current Every Day Smoker  . Packs/day: 1.00  . Years: 20.00  . Pack years: 20.00  . Types: Cigarettes  Smokeless Tobacco Never Used     Outpatient Encounter Medications as of 12/16/2018  Medication Sig  . atorvastatin (LIPITOR) 40 MG tablet Take 1 tablet (40 mg total) by mouth daily at 6 PM.  . buPROPion (WELLBUTRIN SR) 150 MG 12 hr tablet First three days 1 tab daily, then increase to 1 tab twice daily- hold at this dose.  . citalopram (CELEXA) 40 MG tablet Take 1 tablet by mouth daily.  Marland Kitchen lisinopril (PRINIVIL,ZESTRIL) 10 MG tablet TAKE 1 TABLET BY MOUTH WITH BREAKFAST EVERY DAY  . [DISCONTINUED] atorvastatin (LIPITOR) 20 MG tablet TAKE 1 TABLET (20 MG TOTAL) BY MOUTH DAILY. PATIENT MUST HAVE OFFICE VISIT PRIOR TO ANY FURTHER REFILLS  . [DISCONTINUED] lisinopril (PRINIVIL,ZESTRIL) 10 MG tablet TAKE 1 TABLET BY MOUTH WITH BREAKFAST EVERY DAY   No facility-administered encounter medications on file as of 12/16/2018.     Allergies: Patient has no known allergies.  Body mass index is 32.72 kg/m.  Blood pressure 131/80, pulse 76, temperature 98.9 F (37.2 C), temperature source Oral, height 5' 7.25" (1.708 m), weight 210 lb 8 oz (95.5 kg), SpO2 97 %.  Review of Systems  Constitutional: Positive for fatigue. Negative for activity change, appetite change, chills, diaphoresis, fever and unexpected weight change.  HENT: Negative for congestion.   Eyes: Negative for visual disturbance.  Respiratory: Negative for cough, chest tightness,  shortness of breath, wheezing and stridor.   Cardiovascular: Negative for chest pain, palpitations and leg swelling.  Gastrointestinal: Negative for abdominal distention, abdominal pain, blood in stool, constipation, diarrhea, nausea and vomiting.  Endocrine: Negative for cold intolerance, heat intolerance, polydipsia, polyphagia and polyuria.  Genitourinary: Negative for difficulty urinating and flank pain.  Musculoskeletal: Negative for arthralgias, back  pain, gait problem, joint swelling, myalgias, neck pain and neck stiffness.  Skin: Negative for color change, pallor, rash and wound.  Neurological: Negative for dizziness and headaches.  Hematological: Does not bruise/bleed easily.  Psychiatric/Behavioral: Negative for agitation, behavioral problems, confusion, decreased concentration, dysphoric mood, hallucinations, self-injury, sleep disturbance and suicidal ideas. The patient is not nervous/anxious and is not hyperactive.        Objective:   Physical Exam Vitals signs and nursing note reviewed.  Constitutional:      General: He is not in acute distress.    Appearance: Normal appearance. He is normal weight. He is not ill-appearing, toxic-appearing or diaphoretic.  HENT:     Head: Normocephalic and atraumatic.     Right Ear: Tympanic membrane, ear canal and external ear normal. There is no impacted cerumen.     Left Ear: Tympanic membrane, ear canal and external ear normal. There is no impacted cerumen.     Nose: Nose normal. No congestion.     Mouth/Throat:     Mouth: Mucous membranes are moist.     Pharynx: No oropharyngeal exudate.  Eyes:     Extraocular Movements: Extraocular movements intact.     Conjunctiva/sclera: Conjunctivae normal.     Pupils: Pupils are equal, round, and reactive to light.  Neck:     Musculoskeletal: Normal range of motion.  Cardiovascular:     Rate and Rhythm: Normal rate.     Pulses: Normal pulses.     Heart sounds: Normal heart sounds. No murmur. No friction rub. No gallop.   Pulmonary:     Effort: Pulmonary effort is normal. No respiratory distress.     Breath sounds: Normal breath sounds. No wheezing, rhonchi or rales.  Chest:     Chest wall: No tenderness.  Abdominal:     General: Abdomen is flat. There is no distension.     Palpations: Abdomen is soft. There is no mass.     Tenderness: There is no abdominal tenderness. There is no right CVA tenderness, left CVA tenderness, guarding or  rebound.     Hernia: No hernia is present.  Musculoskeletal: Normal range of motion.  Skin:    General: Skin is warm and dry.     Capillary Refill: Capillary refill takes less than 2 seconds.  Neurological:     General: No focal deficit present.     Mental Status: He is alert.     Coordination: Coordination normal.  Psychiatric:        Mood and Affect: Mood normal.        Behavior: Behavior normal.        Thought Content: Thought content normal.        Judgment: Judgment normal.       Assessment & Plan:   1. Screening for HIV (human immunodeficiency virus)   2. Encounter for hepatitis C screening test for low risk patient   3. Need for influenza vaccination   4. Need for Tdap vaccination   5. Elevated LDL cholesterol level   6. Depression, recurrent ([place])   7. Healthcare maintenance   8. Tobacco use   9. Essential hypertension  Elevated LDL cholesterol level The 10-year ASCVD risk score Mario Bussing DC Jr., et al., 2013) is: 19.6%   Values used to calculate the score:     Age: 32 years     Sex: Male     Is Non-Hispanic African American: Yes     Diabetic: No     Tobacco smoker: Yes     Systolic Blood Pressure: 573 mmHg     Is BP treated: Yes     HDL Cholesterol: 40 mg/dL     Total Cholesterol: 135 mg/dL  LDL-79 Increased Atorvastatin from 20mg  to 40mg  QD Follow heart healthy diet and increase regular exercise   Depression, recurrent ([facility]) Mood stable Denies SI/HI  Healthcare maintenance Continue all medications as directed, with one change- Increased Atorvastatin from 20mg  to 40mg  once daily at 6 pm Increase water intake, strive for at least ounces/day.   Follow Heart Healthy diet Increase regular exercise.  Recommend at least 30 minutes daily, 5 days per week of walking, jogging, biking, swimming, YouTube/Pinterest workout videos. AAA Abdominal Ultrasound ordered- someone from imaging center will call you to schedule appt. Follow-up 6 months.  Tobacco  use Currently smoking 15 cigarettes/day  Essential hypertension BP at goal 131/80, HR 76 Continue Lisinopril 10mg  QD    FOLLOW-UP:  Return in about 6 months (around 06/18/2019) for Regular Follow Up, HTN, Obesity, Depression.

## 2018-12-16 ENCOUNTER — Encounter: Payer: Self-pay | Admitting: Adult Health

## 2018-12-16 ENCOUNTER — Ambulatory Visit (INDEPENDENT_AMBULATORY_CARE_PROVIDER_SITE_OTHER): Payer: Managed Care, Other (non HMO) | Admitting: Adult Health

## 2018-12-16 VITALS — BP 131/80 | HR 76 | Temp 98.9°F | Ht 67.25 in | Wt 210.5 lb

## 2018-12-16 DIAGNOSIS — Z23 Encounter for immunization: Secondary | ICD-10-CM

## 2018-12-16 DIAGNOSIS — F339 Major depressive disorder, recurrent, unspecified: Secondary | ICD-10-CM

## 2018-12-16 DIAGNOSIS — Z114 Encounter for screening for human immunodeficiency virus [HIV]: Secondary | ICD-10-CM | POA: Diagnosis not present

## 2018-12-16 DIAGNOSIS — Z1159 Encounter for screening for other viral diseases: Secondary | ICD-10-CM

## 2018-12-16 DIAGNOSIS — Z720 Tobacco use: Secondary | ICD-10-CM

## 2018-12-16 DIAGNOSIS — Z136 Encounter for screening for cardiovascular disorders: Secondary | ICD-10-CM

## 2018-12-16 DIAGNOSIS — I10 Essential (primary) hypertension: Secondary | ICD-10-CM

## 2018-12-16 DIAGNOSIS — Z0000 Encounter for general adult medical examination without abnormal findings: Secondary | ICD-10-CM

## 2018-12-16 DIAGNOSIS — E7800 Pure hypercholesterolemia, unspecified: Secondary | ICD-10-CM

## 2018-12-16 MED ORDER — LISINOPRIL 10 MG PO TABS: ORAL_TABLET | ORAL | 2 refills | Status: DC

## 2018-12-16 MED ORDER — ATORVASTATIN CALCIUM 40 MG PO TABS: 40.0000 mg | ORAL_TABLET | Freq: Every day | ORAL | 2 refills | Status: DC

## 2018-12-16 NOTE — Assessment & Plan Note (Signed)
The 10-year ASCVD risk score [name] DC [name]., et al., 2013) is: 19.6%   Values used to calculate the score:     Age: 58 years     Sex: Male     Is Non-Hispanic African American: Yes     Diabetic: No     Tobacco smoker: Yes     Systolic Blood Pressure: 195 mmHg     Is BP treated: Yes     HDL Cholesterol: 40 mg/dL     Total Cholesterol: 135 mg/dL  LDL-79 Increased Atorvastatin from 20mg  to 40mg  QD Follow heart healthy diet and increase regular exercise

## 2018-12-16 NOTE — Assessment & Plan Note (Signed)
Mood stable Denies SI/HI

## 2018-12-16 NOTE — Patient Instructions (Addendum)

## 2018-12-16 NOTE — Assessment & Plan Note (Signed)
Continue all medications as directed, with one change- Increased Atorvastatin from 20mg  to 40mg  once daily at 6 pm Increase water intake, strive for at least ounces/day.   Follow Heart Healthy diet Increase regular exercise.  Recommend at least 30 minutes daily, 5 days per week of walking, jogging, biking, swimming, YouTube/Pinterest workout videos. AAA Abdominal Ultrasound ordered- someone from imaging center will call you to schedule appt. Follow-up 6 months.

## 2018-12-16 NOTE — Assessment & Plan Note (Signed)
Currently smoking 15 cigarettes/day

## 2018-12-16 NOTE — Assessment & Plan Note (Signed)
BP at goal 131/80, HR 76 Continue Lisinopril 10mg  QD

## 2018-12-17 LAB — HEPATITIS C ANTIBODY: Hep C Virus Ab: <0.1 s/co ratio (ref 0.0–0.9)

## 2018-12-17 LAB — HIV ANTIBODY (ROUTINE TESTING W REFLEX): HIV Screen 4th Generation wRfx: NONREACTIVE

## 2019-01-13 ENCOUNTER — Other Ambulatory Visit (HOSPITAL_COMMUNITY): Payer: Managed Care, Other (non HMO)

## 2019-02-12 ENCOUNTER — Other Ambulatory Visit (HOSPITAL_COMMUNITY): Payer: Managed Care, Other (non HMO)

## 2019-03-06 ENCOUNTER — Other Ambulatory Visit: Payer: Self-pay | Admitting: Adult Health

## 2019-03-06 MED ORDER — CITALOPRAM HYDROBROMIDE 40 MG PO TABS: 40.0000 mg | ORAL_TABLET | Freq: Every day | ORAL | 0 refills | Status: DC

## 2019-03-06 NOTE — Telephone Encounter (Signed)
Patient came by office to request refill on :  ( Originally filled by Other provider not [place]/PCP) citalopram (CELEXA) 40 MG tablet [[id number]]   Order Details  Dose: 1 tablet Route: Oral Frequency: Daily  Dispense Quantity: -- Refills: -- Fills remaining: --        Sig: Take 1 tablet by mouth daily.       Written Date: -- Expiration Date: -- Ordering Date: 09/04/17   Start Date: 08/13/17 End Date: --    Source:  Received from: External Pharmacy       Ordering Provider: -- DEA #:  -- NPI:  --   Authorizing Provider: [provider] DEA #:  -- NPI:  [id number]      ----Forwarding request to medical assistant that if approved pls send to : ( Patient is completely out of medication)  [id number] [address], [place] [phone] (Phone) [phone] (Fax)   --glh

## 2019-03-06 NOTE — Telephone Encounter (Signed)
We have not prescribed these medications for the patient previously.  Please review and refill if appropriate.  T. Nelson, CMA  

## 2019-04-21 ENCOUNTER — Ambulatory Visit (HOSPITAL_COMMUNITY)
Admission: RE | Admit: 2019-04-21 | Payer: Managed Care, Other (non HMO) | Source: Ambulatory Visit | Attending: Adult Health | Admitting: Adult Health

## 2019-05-29 ENCOUNTER — Other Ambulatory Visit: Payer: Self-pay | Admitting: Adult Health

## 2019-06-04 ENCOUNTER — Ambulatory Visit (HOSPITAL_COMMUNITY)
Admission: RE | Admit: 2019-06-04 | Payer: Managed Care, Other (non HMO) | Source: Ambulatory Visit | Attending: Adult Health | Admitting: Adult Health

## 2019-06-17 NOTE — Progress Notes (Deleted)
   Subjective:    Patient ID: Mario Newman, male    DOB: [birth year], 58 y.o.   MRN: [id number]  HPI:12/16/2018 OV: [name] is here for CPE  Reviewed Recent Lab Results CBC- Slightly low MCV, MCH, RDW Poss Iron Def, B12 Def, Folate Def CMP-Stable Lipid Panel-  The 10-year ASCVD risk score Mario Newman DC Jr., et al., 2013) is: 16.9% Values used to calculate the score:  Age: 39 years  Sex: Male  Is Non-Hispanic African American: Yes  Diabetic: No  Tobacco smoker: Yes  Systolic Blood Pressure: 123456 mmHg  Is BP treated: Yes  HDL Cholesterol: 40 mg/dL  Total Cholesterol: 135 mg/dL LDL-79 Rec increasing atorvastatin dosage to lower ASCVD risk Remains in pre-diabetes ranges A1c- 6.2- needs to really focus on improving diet, quitting tobacco, and increasing exercise  TSH-WNL, 0.654   06/18/2019 OV:  [name] is here for 6 month f/u: HTN, Obesity, Depression   Patient Care Team    Relationship Specialty Notifications Start End  Esaw Grandchild, NP PCP - General Family Medicine  08/06/17     Patient Active Problem List   Diagnosis Date Noted  . Chest pain 02/18/2018  . Dyspnea 02/18/2018  . Nocturia 11/20/2017  . 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 Strain of back 11/20/2017  . Elevated LDL cholesterol level 11/20/2017  . Essential hypertension 09/04/2017  . Healthcare maintenance 09/04/2017  . Tobacco use 09/04/2017  . Depression, recurrent ([place]) 09/04/2017     No past medical history on file.   No past surgical history on file.   Family History  Problem Relation Age of Onset  . Hypertension Mother   . Diabetes Father   . Alcohol abuse Father   . Healthy Sister      Social History   Substance and Sexual Activity  Drug Use No     Social History   Substance and Sexual Activity  Alcohol Use No  . Frequency: Never     Social History   Tobacco Use  Smoking Status Current Every Day Smoker  . Packs/day: 1.00  . Years:  20.00  . Pack years: 20.00  . Types: Cigarettes  Smokeless Tobacco Never Used     Outpatient Encounter Medications as of 06/18/2019  Medication Sig  . atorvastatin (LIPITOR) 40 MG tablet Take 1 tablet (40 mg total) by mouth daily at 6 PM.  . buPROPion (WELLBUTRIN SR) 150 MG 12 hr tablet First three days 1 tab daily, then increase to 1 tab twice daily- hold at this dose.  . citalopram (CELEXA) 40 MG tablet TAKE 1 TABLET BY MOUTH EVERY DAY  . lisinopril (PRINIVIL,ZESTRIL) 10 MG tablet TAKE 1 TABLET BY MOUTH WITH BREAKFAST EVERY DAY   No facility-administered encounter medications on file as of 06/18/2019.     Allergies: Patient has no known allergies.  There is no height or weight on file to calculate BMI.  There were no vitals taken for this visit.     Review of Systems     Objective:   Physical Exam        Assessment & Plan:  No diagnosis found.  No problem-specific Assessment & Plan notes found for this encounter.    FOLLOW-UP:  No follow-ups on file.

## 2019-06-18 ENCOUNTER — Ambulatory Visit: Payer: Managed Care, Other (non HMO) | Admitting: Adult Health

## 2019-06-22 ENCOUNTER — Other Ambulatory Visit: Payer: Self-pay | Admitting: Adult Health

## 2019-07-15 ENCOUNTER — Other Ambulatory Visit: Payer: Self-pay

## 2019-07-15 ENCOUNTER — Ambulatory Visit: Payer: Managed Care, Other (non HMO)

## 2019-07-15 DIAGNOSIS — Z03818 Encounter for observation for suspected exposure to other biological agents ruled out: Secondary | ICD-10-CM

## 2019-07-15 DIAGNOSIS — Z20822 Contact with and (suspected) exposure to covid-19: Secondary | ICD-10-CM

## 2019-07-15 NOTE — Progress Notes (Signed)
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  Advised pt that he should get COVID testing and wait an additional week before getting immunizations d/t stressing the immune system if he contracted COVID and has not yet come down with symptoms.  Pt expressed understanding, is agreeable, and left to be tested for COVID.  Charyl Bigger, CMA

## 2019-07-17 LAB — NOVEL CORONAVIRUS, NAA: SARS-CoV-2, NAA: DETECTED — AB

## 2019-07-18 LAB — NOVEL CORONAVIRUS, NAA: SARS-CoV-2, NAA: NEGATIVE

## 2019-07-28 ENCOUNTER — Encounter: Payer: Self-pay | Admitting: Adult Health

## 2019-07-28 ENCOUNTER — Ambulatory Visit (INDEPENDENT_AMBULATORY_CARE_PROVIDER_SITE_OTHER): Payer: Managed Care, Other (non HMO) | Admitting: Adult Health

## 2019-07-28 ENCOUNTER — Other Ambulatory Visit: Payer: Self-pay

## 2019-07-28 VITALS — BP 136/81 | HR 69 | Temp 98.3°F | Ht 67.25 in | Wt 210.4 lb

## 2019-07-28 DIAGNOSIS — M542 Cervicalgia: Secondary | ICD-10-CM | POA: Diagnosis not present

## 2019-07-28 DIAGNOSIS — I10 Essential (primary) hypertension: Secondary | ICD-10-CM

## 2019-07-28 DIAGNOSIS — Z720 Tobacco use: Secondary | ICD-10-CM | POA: Diagnosis not present

## 2019-07-28 DIAGNOSIS — Z23 Encounter for immunization: Secondary | ICD-10-CM | POA: Diagnosis not present

## 2019-07-28 DIAGNOSIS — E7800 Pure hypercholesterolemia, unspecified: Secondary | ICD-10-CM

## 2019-07-28 MED ORDER — CYCLOBENZAPRINE HCL 10 MG PO TABS: 10.0000 mg | ORAL_TABLET | Freq: Every day | ORAL | 0 refills | Status: DC

## 2019-07-28 NOTE — Assessment & Plan Note (Signed)
Lisinopril 10mg  QD

## 2019-07-28 NOTE — Assessment & Plan Note (Signed)
Atorvastatin 40mg  QD Direct LDL checked today

## 2019-07-28 NOTE — Patient Instructions (Addendum)
Musculoskeletal Pain Musculoskeletal pain refers to aches and pains in your bones, joints, muscles, and the tissues that surround them. This pain can occur in any part of the body. It can last for a short time (acute) or a long time (chronic). A physical exam, lab tests, and imaging studies may be done to find the cause of your musculoskeletal pain. Follow these instructions at home:  Lifestyle  Try to control or lower your stress levels. Stress increases muscle tension and can worsen musculoskeletal pain. It is important to recognize when you are anxious or stressed and learn ways to manage it. This may include: ? Meditation or yoga. ? Cognitive or behavioral therapy. ? Acupuncture or massage therapy.  You may continue all activities unless the activities cause more pain. When the pain gets better, slowly resume your normal activities. Gradually increase the intensity and duration of your activities or exercise. Managing pain, stiffness, and swelling  Take over-the-counter and prescription medicines only as told by your health care provider.  When your pain is severe, bed rest may be helpful. Lie or sit in any position that is comfortable, but get out of bed and walk around at least every couple of hours.  If directed, apply heat to the affected area as often as told by your health care provider. Use the heat source that your health care provider recommends, such as a moist heat pack or a heating pad. ? Place a towel between your skin and the heat source. ? Leave the heat on for 20-30 minutes. ? Remove the heat if your skin turns bright red. This is especially important if you are unable to feel pain, heat, or cold. You may have a greater risk of getting burned.  If directed, put ice on the painful area. ? Put ice in a plastic bag. ? Place a towel between your skin and the bag. ? Leave the ice on for 20 minutes, 2-3 times a day. General instructions  Your health care provider may  recommend that you see a physical therapist. This person can help you come up with a safe exercise program. Do any exercises as told by your physical therapist.  Keep all follow-up visits, including any physical therapy visits, as told by your health care providers. This is important. Contact a health care provider if:  Your pain gets worse.  Medicines do not help ease your pain.  You cannot use the part of your body that hurts, such as your arm, leg, or neck.  You have trouble sleeping.  You have trouble doing your normal activities. Get help right away if:  You have a new injury and your pain is worse or different.  You feel numb or you have tingling in the painful area. Summary  Musculoskeletal pain refers to aches and pains in your bones, joints, muscles, and the tissues that surround them.  This pain can occur in any part of the body.  Your health care provider may recommend that you see a physical therapist. This person can help you come up with a safe exercise program. Do any exercises as told by your physical therapist.  Lower your stress level. Stress can worsen musculoskeletal pain. Ways to lower stress may include meditation, yoga, cognitive or behavioral therapy, acupuncture, and massage therapy. This information is not intended to replace advice given to you by your health care provider. Make sure you discuss any questions you have with your health care provider. Document Released: 09/25/2005 Document Revised: 09/07/2017 Document Reviewed:   10/25/2016 Elsevier Patient Education  [place].  Alternate OTC Acetaminophen and Ibuprofen as needed for pain control. Use Cyclobenzaprine as needed at bedtime- do not drive or use alcohol when taking. MRI ordered- will contact you with results. Do not exercise, over-exert yourself. If your experience severe pain, change in vision- seek immediate medical care. We will call you with lab results. Continue to social  distance and wear a mask when in public. Follow-up in 2 weeks. FEEL BETTER!

## 2019-07-28 NOTE — Assessment & Plan Note (Signed)
Alternate OTC Acetaminophen and Ibuprofen as needed for pain control. Use Cyclobenzaprine as needed at bedtime- do not drive or use alcohol when taking. MRI ordered- will contact you with results. Do not exercise, over-exert yourself. If your experience severe pain, change in vision- seek immediate medical care. We will call you with lab results. Continue to social distance and wear a mask when in public. Follow-up in 2 weeks.

## 2019-07-28 NOTE — Progress Notes (Signed)
Subjective:    Patient ID: Mario Newman, male    DOB: 1961-05-24, 58 y.o.   MRN: [id number]  HPI:  Mr. Mccully presents R posterior neck pain that began  >4 weeks ago after "a violent sneeze". He denies trauma or injury prior to onset of sx's. He reports pain is localized R posterior neck, without radiation or radiculopathy. He reports constant pain, that is described as "sharp" and will pain level will range between 4/10-9/10. He denies HA, change in vision. He denies nausea of dizziness. He denies previous hx of cervical neck injury. He continues to smoke- currently 3/4 pack per day- again declined smoking cessation. He denies first degree family hx of MI/CVA He is on Atorvastatin 40mg  QD He denies CP/chest pressure with exertion  Patient Care Team    Relationship Specialty Notifications Start End  Esaw Grandchild, NP PCP - General Family Medicine  08/06/17     Patient Active Problem List   Diagnosis Date Noted  . Cervical pain (neck) 07/28/2019  . Chest pain 02/18/2018  . Dyspnea 02/18/2018  . Nocturia 11/20/2017  . 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 Strain of back 11/20/2017  . Elevated LDL cholesterol level 11/20/2017  . Essential hypertension 09/04/2017  . Healthcare maintenance 09/04/2017  . Tobacco use 09/04/2017  . Depression, recurrent ([place]) 09/04/2017     History reviewed. No pertinent past medical history.   History reviewed. No pertinent surgical history.   Family History  Problem Relation Age of Onset  . Hypertension Mother   . Diabetes Father   . Alcohol abuse Father   . Healthy Sister      Social History   Substance and Sexual Activity  Drug Use No     Social History   Substance and Sexual Activity  Alcohol Use No  . Frequency: Never     Social History   Tobacco Use  Smoking Status Current Every Day Smoker  . Packs/day: 1.00  . Years: 20.00  . Pack years: 20.00  . Types: Cigarettes  Smokeless Tobacco Never Used      Outpatient Encounter Medications as of 07/28/2019  Medication Sig  . atorvastatin (LIPITOR) 40 MG tablet Take 1 tablet (40 mg total) by mouth daily at 6 PM.  . buPROPion (WELLBUTRIN SR) 150 MG 12 hr tablet First three days 1 tab daily, then increase to 1 tab twice daily- hold at this dose.  . citalopram (CELEXA) 40 MG tablet TAKE 1 TABLET BY MOUTH EVERY DAY  . lisinopril (PRINIVIL,ZESTRIL) 10 MG tablet TAKE 1 TABLET BY MOUTH WITH BREAKFAST EVERY DAY  . cyclobenzaprine (FLEXERIL) 10 MG tablet Take 1 tablet (10 mg total) by mouth at bedtime.   No facility-administered encounter medications on file as of 07/28/2019.     Allergies: Patient has no known allergies.  Body mass index is 32.71 kg/m.  Blood pressure 136/81, pulse 69, temperature 98.3 F (36.8 C), temperature source Oral, height 5' 7.25" (1.708 m), weight 210 lb 6.4 oz (95.4 kg), SpO2 97 %. Review of Systems  Constitutional: Positive for activity change and fatigue. Negative for appetite change, chills, diaphoresis, fever and unexpected weight change.  Eyes: Negative for photophobia and visual disturbance.  Respiratory: Negative for cough, chest tightness, shortness of breath, wheezing and stridor.   Cardiovascular: Negative for chest pain, palpitations and leg swelling.  Musculoskeletal: Positive for myalgias, neck pain and neck stiffness.  Neurological: Negative for dizziness, tremors, weakness and headaches.  Hematological: Negative for adenopathy. Does not bruise/bleed easily.  Psychiatric/Behavioral: Positive for sleep disturbance.       Objective:   Physical Exam Vitals signs and nursing note reviewed.  Constitutional:      General: He is not in acute distress.    Appearance: Normal appearance. He is not ill-appearing, toxic-appearing or diaphoretic.  HENT:     Head: Normocephalic and atraumatic.  Neck:     Musculoskeletal: Neck supple. Pain with movement and muscular tenderness present. No neck rigidity, crepitus  or torticollis.      Comments: R posterior neck- TTP  ROM normal- but guarded and hesitant   Cardiovascular:     Rate and Rhythm: Normal rate and regular rhythm.     Pulses: Normal pulses.     Heart sounds: Normal heart sounds. No murmur. No friction rub. No gallop.   Musculoskeletal:        General: Tenderness present. No swelling.     Cervical back: He exhibits tenderness, pain and spasm. He exhibits normal range of motion.     Thoracic back: Normal.  Lymphadenopathy:     Cervical: No cervical adenopathy.  Skin:    General: Skin is warm.     Capillary Refill: Capillary refill takes less than 2 seconds.  Neurological:     Mental Status: He is oriented to person, place, and time.     Coordination: Coordination normal.     Comments: R grip strength ever so slightly weaker than L grip strength   Psychiatric:        Mood and Affect: Mood normal.        Behavior: Behavior normal.        Thought Content: Thought content normal.        Judgment: Judgment normal.           Assessment & Plan:   1. Elevated LDL cholesterol level   2. Need for zoster vaccination   3. Need for influenza vaccination   4. Cervical pain (neck)   5. Tobacco use   6. Essential hypertension     Cervical pain (neck) Alternate OTC Acetaminophen and Ibuprofen as needed for pain control. Use Cyclobenzaprine as needed at bedtime- do not drive or use alcohol when taking. MRI ordered- will contact you with results. Do not exercise, over-exert yourself. If your experience severe pain, change in vision- seek immediate medical care. We will call you with lab results. Continue to social distance and wear a mask when in public. Follow-up in 2 weeks.  Tobacco use Currently smoking 3/4 pack per day He declined smoking cessation today  Essential hypertension Lisinopril 10mg  QD  Elevated LDL cholesterol level Atorvastatin 40mg  QD Direct LDL checked today    FOLLOW-UP:  Return in about 2 weeks  (around 08/11/2019).

## 2019-07-28 NOTE — Assessment & Plan Note (Signed)
Currently smoking 3/4 pack per day He declined smoking cessation today

## 2019-07-29 ENCOUNTER — Encounter: Payer: Self-pay | Admitting: Adult Health

## 2019-07-29 LAB — LDL CHOLESTEROL, DIRECT: LDL Direct: 56 mg/dL (ref 0–99)

## 2019-08-01 ENCOUNTER — Other Ambulatory Visit: Payer: Managed Care, Other (non HMO)

## 2019-08-04 ENCOUNTER — Telehealth: Payer: Self-pay | Admitting: Adult Health

## 2019-08-04 NOTE — Telephone Encounter (Signed)
Called patient and left VM about [employer] denying his image PA. Told patient that if he had any questions or concerns to please reach out to our office.

## 2019-08-08 NOTE — Progress Notes (Deleted)
   Subjective:    Patient ID: Mario Newman, male    DOB: 1961-04-11, 58 y.o.   MRN: [id number]  HPI:07/28/2019 OV: Mario Newman presents R posterior neck pain that began  >4 weeks ago after "a violent sneeze". He denies trauma or injury prior to onset of sx's. He reports pain is localized R posterior neck, without radiation or radiculopathy. He reports constant pain, that is described as "sharp" and will pain level will range between 4/10-9/10. He denies HA, change in vision. He denies nausea of dizziness. He denies previous hx of cervical neck injury. He continues to smoke- currently 3/4 pack per day- again declined smoking cessation. He denies first degree family hx of MI/CVA He is on Atorvastatin 40mg  QD He denies CP/chest pressure with exertion  08/11/2019 OV:    Patient Care Team    Relationship Specialty Notifications Start End  Esaw Grandchild, NP PCP - General Family Medicine  08/06/17     Patient Active Problem List   Diagnosis Date Noted  . Cervical pain (neck) 07/28/2019  . Chest pain 02/18/2018  . Dyspnea 02/18/2018  . Nocturia 11/20/2017  . 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 Strain of back 11/20/2017  . Elevated LDL cholesterol level 11/20/2017  . Essential hypertension 09/04/2017  . Healthcare maintenance 09/04/2017  . Tobacco use 09/04/2017  . Depression, recurrent ([place]) 09/04/2017     No past medical history on file.   No past surgical history on file.   Family History  Problem Relation Age of Onset  . Hypertension Mother   . Diabetes Father   . Alcohol abuse Father   . Healthy Sister      Social History   Substance and Sexual Activity  Drug Use No     Social History   Substance and Sexual Activity  Alcohol Use No  . Frequency: Never     Social History   Tobacco Use  Smoking Status Current Every Day Smoker  . Packs/day: 1.00  . Years: 20.00  . Pack years: 20.00  . Types: Cigarettes  Smokeless Tobacco Never Used      Outpatient Encounter Medications as of 08/11/2019  Medication Sig  . atorvastatin (LIPITOR) 40 MG tablet Take 1 tablet (40 mg total) by mouth daily at 6 PM.  . buPROPion (WELLBUTRIN SR) 150 MG 12 hr tablet First three days 1 tab daily, then increase to 1 tab twice daily- hold at this dose.  . citalopram (CELEXA) 40 MG tablet TAKE 1 TABLET BY MOUTH EVERY DAY  . cyclobenzaprine (FLEXERIL) 10 MG tablet Take 1 tablet (10 mg total) by mouth at bedtime.  Marland Kitchen lisinopril (PRINIVIL,ZESTRIL) 10 MG tablet TAKE 1 TABLET BY MOUTH WITH BREAKFAST EVERY DAY   No facility-administered encounter medications on file as of 08/11/2019.     Allergies: Patient has no known allergies.  There is no height or weight on file to calculate BMI.  There were no vitals taken for this visit.     Review of Systems     Objective:   Physical Exam        Assessment & Plan:  No diagnosis found.  No problem-specific Assessment & Plan notes found for this encounter.    FOLLOW-UP:  No follow-ups on file.

## 2019-08-11 ENCOUNTER — Ambulatory Visit: Payer: Managed Care, Other (non HMO) | Admitting: Adult Health

## 2019-09-02 ENCOUNTER — Other Ambulatory Visit: Payer: Self-pay | Admitting: Adult Health

## 2019-09-02 NOTE — Telephone Encounter (Signed)
MyChart message sent to pt re: need for appt prior to any further refills.  Charyl Bigger, CMA

## 2019-09-16 NOTE — Progress Notes (Signed)
Received Epic notification that pt has not read MyChart message regarding prescription refill.     Medication refill  From  Fonnie Mu, CMA To  [facility]  09/02/2019 11:39 AM  Mr. Rushin,   [place] office has received a refill request from your pharmacy for citalopram. When Valetta Fuller last saw you on 08/11/2019, she instructed you to follow up with her in 2 weeks. I noticed that you do not have an appointment scheduled in the near future. Therefore, it is our office policy that we may only refill any medications for 30 days only and you must have a follow up within that 30 days to obtain any further refills. Please call our office to schedule this appointment.   Thank you!   Kenney Houseman, CMA for  Mina Marble, NP     [address]  MyChart User Last Read On  Advaith Vaynshteyn Not Read        Letter mailed to pt with information.  Charyl Bigger, CMA

## 2019-09-23 ENCOUNTER — Telehealth: Payer: Self-pay

## 2019-09-23 ENCOUNTER — Other Ambulatory Visit: Payer: Self-pay | Admitting: Adult Health

## 2019-09-23 NOTE — Telephone Encounter (Signed)
Please call pt to schedule appt.  No further refills until pt is seen.  T. Jeromiah Ohalloran, CMA  

## 2019-09-25 ENCOUNTER — Other Ambulatory Visit: Payer: Self-pay | Admitting: Adult Health

## 2019-09-30 ENCOUNTER — Other Ambulatory Visit: Payer: Self-pay | Admitting: Adult Health

## 2019-10-11 ENCOUNTER — Other Ambulatory Visit: Payer: Self-pay | Admitting: Adult Health

## 2019-10-15 ENCOUNTER — Other Ambulatory Visit: Payer: Self-pay | Admitting: Adult Health

## 2019-10-19 ENCOUNTER — Other Ambulatory Visit: Payer: Self-pay | Admitting: Adult Health

## 2019-10-20 ENCOUNTER — Telehealth: Payer: Self-pay

## 2019-10-20 NOTE — Telephone Encounter (Signed)
Please call pt to schedule f/u.  No further refills until pt is seen.  Charyl Bigger, CMA

## 2019-10-27 ENCOUNTER — Other Ambulatory Visit: Payer: Self-pay | Admitting: Adult Health

## 2019-10-27 ENCOUNTER — Telehealth: Payer: Self-pay | Admitting: Adult Health

## 2019-10-27 NOTE — Telephone Encounter (Signed)
Patient scheduled a med f/u for tomorrow but has been out of his Celexa for the past 3 days. He is hoping that we can send a refill order to his pharm since he is scheduled tomorrow. Please advise.

## 2019-10-28 ENCOUNTER — Ambulatory Visit (INDEPENDENT_AMBULATORY_CARE_PROVIDER_SITE_OTHER): Payer: Managed Care, Other (non HMO) | Admitting: Adult Health

## 2019-10-28 ENCOUNTER — Encounter: Payer: Self-pay | Admitting: Adult Health

## 2019-10-28 ENCOUNTER — Other Ambulatory Visit: Payer: Self-pay

## 2019-10-28 DIAGNOSIS — I10 Essential (primary) hypertension: Secondary | ICD-10-CM | POA: Diagnosis not present

## 2019-10-28 DIAGNOSIS — Z0000 Encounter for general adult medical examination without abnormal findings: Secondary | ICD-10-CM

## 2019-10-28 DIAGNOSIS — F339 Major depressive disorder, recurrent, unspecified: Secondary | ICD-10-CM | POA: Diagnosis not present

## 2019-10-28 MED ORDER — CITALOPRAM HYDROBROMIDE 40 MG PO TABS: 40.0000 mg | ORAL_TABLET | Freq: Every day | ORAL | 0 refills | Status: DC

## 2019-10-28 MED ORDER — ATORVASTATIN CALCIUM 40 MG PO TABS: ORAL_TABLET | ORAL | 0 refills | Status: DC

## 2019-10-28 MED ORDER — LISINOPRIL 10 MG PO TABS: ORAL_TABLET | ORAL | 0 refills | Status: DC

## 2019-10-28 NOTE — Assessment & Plan Note (Signed)
Lisinopril 10mg  QD He does not check BP/HR at home- encouraged to check few times per week Tobacco cessation encouraged

## 2019-10-28 NOTE — Assessment & Plan Note (Signed)
He reports stable mood, denies SI/HI He is currently on Citalopram 40mg  QD.

## 2019-10-28 NOTE — Assessment & Plan Note (Signed)
  Assessment and Plan: Continue all medications as directed. Remain well hydrated, follow Mediterranean diet. Reduce to stop tobacco use- you can do it. Slowly increase weekly biking. Continue to social distance and wear a mask when in public.  Follow Up Instructions: Fasting labs end of Feb, CPE early March    I discussed the assessment and treatment plan with the patient. The patient was provided an opportunity to ask questions and all were answered. The patient agreed with the plan and demonstrated an understanding of the instructions.   The patient was advised to call back or seek an in-person evaluation if the symptoms worsen or if the condition fails to improve as anticipated.

## 2019-10-28 NOTE — Progress Notes (Signed)
Virtual Visit via Telephone Note  I connected with Lynden Oxford on 10/28/19 at  2:45 PM EST by telephone and verified that I am speaking with the correct person using two identifiers.  Location: Patient: Mario Newman   I discussed the limitations, risks, security and privacy concerns of performing an evaluation and management service by telephone and the availability of in person appointments. I also discussed with the patient that there may be a patient responsible charge related to this service. The patient expressed understanding and agreed to proceed.   History of Present Illness: Mr. Kirschman calls in for regular f/u: Recurrent Depression, HLD, HTN He reports medication compliance, denies SE He reports stable mood, denies SI/HI He is currently on Citalopram 40mg  QD. 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 He does not check BP/HR at home. He denies CP/chest tightness with exertion. He is smoking 1/2 pack per day- he declined tobacco cessation. He reports using stationary bike- 17mins ride per week. He has been trying to reduce CHO/saturated fat in diet.  Patient Care Team    Relationship Specialty Notifications Start End  Esaw Grandchild, NP PCP - General Family Medicine  08/06/17     Patient Active Problem List   Diagnosis Date Noted  . Cervical pain (neck) 07/28/2019  . Chest pain 02/18/2018  . Dyspnea 02/18/2018  . Nocturia 11/20/2017  . 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 Strain of back 11/20/2017  . Elevated LDL cholesterol level 11/20/2017  . Essential hypertension 09/04/2017  . Healthcare maintenance 09/04/2017  . Tobacco use 09/04/2017  . Depression, recurrent ([place]) 09/04/2017     History reviewed. No pertinent past medical history.   History reviewed. No pertinent surgical history.   Family History  Problem Relation Age of Onset  . Hypertension Mother   . Diabetes Father   . Alcohol abuse Father    . Healthy Sister      Social History   Substance and Sexual Activity  Drug Use No     Social History   Substance and Sexual Activity  Alcohol Use No     Social History   Tobacco Use  Smoking Status Current Every Day Smoker  . Packs/day: 1.00  . Years: 20.00  . Pack years: 20.00  . Types: Cigarettes  Smokeless Tobacco Never Used     Outpatient Encounter Medications as of 10/28/2019  Medication Sig  . atorvastatin (LIPITOR) 40 MG tablet TAKE 1 TABLET DAILY AT 6 PM.  . buPROPion (WELLBUTRIN SR) 150 MG 12 hr tablet First three days 1 tab daily, then increase to 1 tab twice daily- hold at this dose.  . citalopram (CELEXA) 40 MG tablet Take 1 tablet (40 mg total) by mouth daily. OFFICE VISIT REQUIRED PRIOR TO ANY FURTHER REFILLS  . lisinopril (ZESTRIL) 10 MG tablet 1 tablet by mouth  . [DISCONTINUED] atorvastatin (LIPITOR) 40 MG tablet TAKE 1 TABLET DAILY AT 6 PM. OFFICE VISIT REQUIRED PRIOR TO ANY FURTHER REFILLS  . [DISCONTINUED] citalopram (CELEXA) 40 MG tablet TAKE 1 TABLET (40 MG TOTAL) BY MOUTH DAILY. OFFICE VISIT REQUIRED PRIOR TO ANY FURTHER REFILLS  . [DISCONTINUED] lisinopril (ZESTRIL) 10 MG tablet TAKE 1 TABLET BY MOUTH WITH BREAKFAST EVERY DAY. OFFICE VISIT REQUIRED PRIOR TO ANY FURTHER REFILLS  . [DISCONTINUED] cyclobenzaprine (FLEXERIL) 10 MG tablet Take 1 tablet (10 mg total) by mouth at bedtime.   No facility-administered encounter medications on file as of 10/28/2019.    Allergies: Patient  has no known allergies.  Body mass index is 32.34 kg/m.  Temperature (!) 97.3 F (36.3 C), temperature source Skin, height 5' 7.25" (1.708 m), weight 208 lb (94.3 kg). Review of Systems: General:   Denies fever, chills, unexplained weight loss.  Optho/Auditory:   Denies visual changes, blurred vision/LOV Respiratory:   Denies SOB, DOE more than baseline levels.  Cardiovascular:   Denies chest pain, palpitations, new onset peripheral edema  Gastrointestinal:    Denies nausea, vomiting, diarrhea.  Genitourinary: Denies dysuria, freq/ urgency, flank pain or discharge from genitals.  Endocrine:     Denies hot or cold intolerance, polyuria, polydipsia. Musculoskeletal:   Denies unexplained myalgias, joint swelling, unexplained arthralgias, gait problems.  Skin:  Denies rash, suspicious lesions Neurological:     Denies dizziness, unexplained weakness, numbness  Psychiatric/Behavioral:   Denies mood changes, suicidal or homicidal ideations, hallucinations Stress +   Observations/Objective: No acute distress noted during telephone conversation.  Assessment and Plan: Continue all medications as directed. Remain well hydrated, follow Mediterranean diet. Reduce to stop tobacco use- you can do it. Slowly increase weekly biking. Continue to social distance and wear a mask when in public.  Follow Up Instructions: Fasting labs end of Feb, CPE early March    I discussed the assessment and treatment plan with the patient. The patient was provided an opportunity to ask questions and all were answered. The patient agreed with the plan and demonstrated an understanding of the instructions.   The patient was advised to call back or seek an in-person evaluation if the symptoms worsen or if the condition fails to improve as anticipated.  I provided 22 minutes of non-face-to-face time during this encounter.   Esaw Grandchild, NP

## 2019-10-28 NOTE — Telephone Encounter (Signed)
10/28/2019  Will address refill at today's visit.  Per office protocol, pt must have OV prior to refilling medications.  Charyl Bigger, CMA

## 2019-10-31 ENCOUNTER — Other Ambulatory Visit: Payer: Self-pay | Admitting: Adult Health

## 2019-11-03 ENCOUNTER — Other Ambulatory Visit: Payer: Self-pay | Admitting: Adult Health

## 2019-12-04 ENCOUNTER — Other Ambulatory Visit: Payer: Managed Care, Other (non HMO)

## 2020-01-06 ENCOUNTER — Encounter: Payer: Managed Care, Other (non HMO) | Admitting: Adult Health

## 2020-01-19 ENCOUNTER — Other Ambulatory Visit: Payer: Self-pay

## 2020-01-19 ENCOUNTER — Encounter: Payer: Self-pay | Admitting: Family Medicine

## 2020-01-19 ENCOUNTER — Ambulatory Visit (INDEPENDENT_AMBULATORY_CARE_PROVIDER_SITE_OTHER): Payer: Managed Care, Other (non HMO) | Admitting: Family Medicine

## 2020-01-19 VITALS — BP 124/80 | HR 95 | Temp 98.1°F | Resp 12 | Ht 68.0 in | Wt 216.6 lb

## 2020-01-19 DIAGNOSIS — Z0000 Encounter for general adult medical examination without abnormal findings: Secondary | ICD-10-CM | POA: Diagnosis not present

## 2020-01-19 DIAGNOSIS — K2100 Gastro-esophageal reflux disease with esophagitis, without bleeding: Secondary | ICD-10-CM

## 2020-01-19 DIAGNOSIS — Z1211 Encounter for screening for malignant neoplasm of colon: Secondary | ICD-10-CM | POA: Diagnosis not present

## 2020-01-19 DIAGNOSIS — Z23 Encounter for immunization: Secondary | ICD-10-CM | POA: Diagnosis not present

## 2020-01-19 DIAGNOSIS — I10 Essential (primary) hypertension: Secondary | ICD-10-CM

## 2020-01-19 DIAGNOSIS — R29898 Other symptoms and signs involving the musculoskeletal system: Secondary | ICD-10-CM

## 2020-01-19 DIAGNOSIS — E7800 Pure hypercholesterolemia, unspecified: Secondary | ICD-10-CM

## 2020-01-19 LAB — POC HEMOCCULT BLD/STL (OFFICE/1-CARD/DIAGNOSTIC): Fecal Occult Blood, POC: NEGATIVE

## 2020-01-19 NOTE — Patient Instructions (Addendum)
Begin generic famotidine over the counter to control heartburn.   Can add prilosec generic in 2 wks if not well controlled with med and LIFESTYLE MODification Try not to eat within three hours of lying down.   Use of caffeine and smoking will contribute to heartburn.   Try to avoid caffeine past 3 PM, and avoid smoking before bedtime.     Preventive Care, Male Preventive care refers to lifestyle choices and visits with your health care provider that can promote health and wellness. What does preventive care include?   A yearly physical exam. This is also called an annual well check.  Dental exams once or twice a year.  Routine eye exams. Ask your health care provider how often you should have your eyes checked.  Personal lifestyle choices, including: ? Daily care of your teeth and gums. ? Regular physical activity. ? Eating a healthy diet. ? Avoiding tobacco and drug use. ? Limiting alcohol use. ? Practicing safe sex. ? Taking low doses of aspirin every day. ? Taking vitamin and mineral supplements as recommended by your health care provider. What happens during an annual well check? The services and screenings done by your health care provider during your annual well check will depend on your age, overall health, lifestyle risk factors, and family history of disease. Counseling Your health care provider may ask you questions about your:  Alcohol use.  Tobacco use.  Drug use.  Emotional well-being.  Home and relationship well-being.  Sexual activity.  Eating habits.  History of falls.  Memory and ability to understand (cognition).  Work and work Statistician. Screening You may have the following tests or measurements:  Height, weight, and BMI.  Blood pressure.  Lipid and cholesterol levels. These may be checked every 5 years, or more frequently if you are over 72 years old.  Skin check.  Lung cancer screening. You may have this screening every year  starting at age 32 if you have a 30-pack-year history of smoking and currently smoke or have quit within the past 15 years.  Colorectal cancer screening. All adults should have this screening starting at age 6 and continuing until age 62. You will have tests every 1-10 years, depending on your results and the type of screening test. People at increased risk should start screening at an earlier age. Screening tests may include: ? Guaiac-based fecal occult blood testing. ? Fecal immunochemical test (FIT). ? Stool DNA test. ? Virtual colonoscopy. ? Sigmoidoscopy. During this test, a flexible tube with a tiny camera (sigmoidoscope) is used to examine your rectum and lower colon. The sigmoidoscope is inserted through your anus into your rectum and lower colon. ? Colonoscopy. During this test, a long, thin, flexible tube with a tiny camera (colonoscope) is used to examine your entire colon and rectum.  Prostate cancer screening. Recommendations will vary depending on your family history and other risks.  Hepatitis C blood test.  Hepatitis B blood test.  Sexually transmitted disease (STD) testing.  Diabetes screening. This is done by checking your blood sugar (glucose) after you have not eaten for a while (fasting). You may have this done every 1-3 years.  Abdominal aortic aneurysm (AAA) screening. You may need this if you are a current or former smoker.  Osteoporosis. You may be screened starting at age 42 if you are at high risk. Talk with your health care provider about your test results, treatment options, and if necessary, the need for more tests. Vaccines Your health care provider  may recommend certain vaccines, such as:  Influenza vaccine. This is recommended every year.  Tetanus, diphtheria, and acellular pertussis (Tdap, Td) vaccine. You may need a Td booster every 10 years.  Varicella vaccine. You may need this if you have not been vaccinated.  Zoster vaccine. You may need this  after age 3.  Measles, mumps, and rubella (MMR) vaccine. You may need at least one dose of MMR if you were born in 1957 or later. You may also need a second dose.  Pneumococcal 13-valent conjugate (PCV13) vaccine. One dose is recommended after age 78.  Pneumococcal polysaccharide (PPSV23) vaccine. One dose is recommended after age 68.  Meningococcal vaccine. You may need this if you have certain conditions.  Hepatitis A vaccine. You may need this if you have certain conditions or if you travel or work in places where you may be exposed to hepatitis A.  Hepatitis B vaccine. You may need this if you have certain conditions or if you travel or work in places where you may be exposed to hepatitis B.  Haemophilus influenzae type b (Hib) vaccine. You may need this if you have certain risk factors. Talk to your health care provider about which screenings and vaccines you need and how often you need them. This information is not intended to replace advice given to you by your health care provider. Make sure you discuss any questions you have with your health care provider. Document Released: 10/22/2015 Document Revised: 11/15/2017 Document Reviewed: 07/27/2015 Elsevier Interactive Patient Education  2019 [place] for Adults, Male A healthy lifestyle and preventive care can promote health and wellness. Preventive health guidelines for men include the following key practices:  A routine yearly physical is a good way to check with your health care provider about your health and preventative screening. It is a chance to share any concerns and updates on your health and to receive a thorough exam.  Visit your dentist for a routine exam and preventative care every 6 months. Brush your teeth twice a day and floss once a day. Good oral hygiene prevents tooth decay and gum disease.  The frequency of eye exams is based on your age, health, family medical history, use of  contact lenses, and other factors. Follow your health care provider's recommendations for frequency of eye exams.  Eat a healthy diet. Foods such as vegetables, fruits, whole grains, low-fat dairy products, and lean protein foods contain the nutrients you need without too many calories. Decrease your intake of foods high in solid fats, added sugars, and salt. Eat the right amount of calories for you. Get information about a proper diet from your health care provider, if necessary.  Regular physical exercise is one of the most important things you can do for your health. Most adults should get at least 150 minutes of moderate-intensity exercise (any activity that increases your heart rate and causes you to sweat) each week. In addition, most adults need muscle-strengthening exercises on 2 or more days a week.  Maintain a healthy weight. The body mass index (BMI) is a screening tool to identify possible weight problems. It provides an estimate of body fat based on height and weight. Your health care provider can find your BMI and can help you achieve or maintain a healthy weight. For adults 20 years and older:  A BMI below 18.5 is considered underweight.  A BMI of 18.5 to 24.9 is normal.  A BMI of  25 to 29.9 is considered overweight.  A BMI of 30 and above is considered obese.  Maintain normal blood lipids and cholesterol levels by exercising and minimizing your intake of saturated fat. Eat a balanced diet with plenty of fruit and vegetables. Blood tests for lipids and cholesterol should begin at age 110 and be repeated every 5 years. If your lipid or cholesterol levels are high, you are over 50, or you are at high risk for heart disease, you may need your cholesterol levels checked more frequently. Ongoing high lipid and cholesterol levels should be treated with medicines if diet and exercise are not working.  If you smoke, find out from your health care provider how to quit. If you do not use  tobacco, do not start.  Lung cancer screening is recommended for adults aged 31-80 years who are at high risk for developing lung cancer because of a history of smoking. A yearly low-dose CT scan of the lungs is recommended for people who have at least a 30-pack-year history of smoking and are a current smoker or have quit within the past 15 years. A pack year of smoking is smoking an average of 1 pack of cigarettes a day for 1 year (for example: 1 pack a day for 30 years or 2 packs a day for 15 years). Yearly screening should continue until the smoker has stopped smoking for at least 15 years. Yearly screening should be stopped for people who develop a health problem that would prevent them from having lung cancer treatment.  If you choose to drink alcohol, do not have more than 2 drinks per day. One drink is considered to be 12 ounces (355 mL) of beer, 5 ounces (148 mL) of wine, or 1.5 ounces (44 mL) of liquor.  Avoid use of street drugs. Do not share needles with anyone. Ask for help if you need support or instructions about stopping the use of drugs.  High blood pressure causes heart disease and increases the risk of stroke. Your blood pressure should be checked at least every 1-2 years. Ongoing high blood pressure should be treated with medicines, if weight loss and exercise are not effective.  If you are 19-8 years old, ask your health care provider if you should take aspirin to prevent heart disease.  Diabetes screening is done by taking a blood sample to check your blood glucose level after you have not eaten for a certain period of time (fasting). If you are not overweight and you do not have risk factors for diabetes, you should be screened once every 3 years starting at age 19. If you are overweight or obese and you are 64-65 years of age, you should be screened for diabetes every year as part of your cardiovascular risk assessment.  Colorectal cancer can be detected and often prevented.  Most routine colorectal cancer screening begins at the age of 51 and continues through age 64. However, your health care provider may recommend screening at an earlier age if you have risk factors for colon cancer. On a yearly basis, your health care provider may provide home test kits to check for hidden blood in the stool. Use of a small camera at the end of a tube to directly examine the colon (sigmoidoscopy or colonoscopy) can detect the earliest forms of colorectal cancer. Talk to your health care provider about this at age 23, when routine screening begins. Direct exam of the colon should be repeated every 5-10 years through age 26, unless  early forms of precancerous polyps or small growths are found.  People who are at an increased risk for hepatitis B should be screened for this virus. You are considered at high risk for hepatitis B if:  You were born in a country where hepatitis B occurs often. Talk with your health care provider about which countries are considered high risk.  Your parents were born in a high-risk country and you have not received a shot to protect against hepatitis B (hepatitis B vaccine).  You have HIV or AIDS.  You use needles to inject street drugs.  You live with, or have sex with, someone who has hepatitis B.  You are a man who has sex with other men (MSM).  You get hemodialysis treatment.  You take certain medicines for conditions such as cancer, organ transplantation, and autoimmune conditions.  Hepatitis C blood testing is recommended for all people born from [birth year] through 1965 and any individual with known risks for hepatitis C.  Practice safe sex. Use condoms and avoid high-risk sexual practices to reduce the spread of sexually transmitted infections (STIs). STIs include gonorrhea, chlamydia, syphilis, trichomonas, herpes, HPV, and human immunodeficiency virus (HIV). Herpes, HIV, and HPV are viral illnesses that have no cure. They can result in disability,  cancer, and death.  If you are a man who has sex with other men, you should be screened at least once per year for:  HIV.  Urethral, rectal, and pharyngeal infection of gonorrhea, chlamydia, or both.  If you are at risk of being infected with HIV, it is recommended that you take a prescription medicine daily to prevent HIV infection. This is called preexposure prophylaxis (PrEP). You are considered at risk if:  You are a man who has sex with other men (MSM) and have other risk factors.  You are a heterosexual man, are sexually active, and are at increased risk for HIV infection.  You take drugs by injection.  You are sexually active with a partner who has HIV.  Talk with your health care provider about whether you are at high risk of being infected with HIV. If you choose to begin PrEP, you should first be tested for HIV. You should then be tested every 3 months for as long as you are taking PrEP.  A one-time screening for abdominal aortic aneurysm (AAA) and surgical repair of large AAAs by ultrasound are recommended for men ages 50 to 72 years who are current or former smokers.  Healthy men should no longer receive prostate-specific antigen (PSA) blood tests as part of routine cancer screening. Talk with your health care provider about prostate cancer screening.  Testicular cancer screening is not recommended for adult males who have no symptoms. Screening includes self-exam, a health care provider exam, and other screening tests. Consult with your health care provider about any symptoms you have or any concerns you have about testicular cancer.  Use sunscreen. Apply sunscreen liberally and repeatedly throughout the day. You should seek shade when your shadow is shorter than you. Protect yourself by wearing long sleeves, pants, a wide-brimmed hat, and sunglasses year round, whenever you are outdoors.  Once a month, do a whole-body skin exam, using a mirror to look at the skin on your  back. Tell your health care provider about new moles, moles that have irregular borders, moles that are larger than a pencil eraser, or moles that have changed in shape or color.  Stay current with required vaccines (immunizations).  Influenza vaccine. All  adults should be immunized every year.  Tetanus, diphtheria, and acellular pertussis (Td, Tdap) vaccine. An adult who has not previously received Tdap or who does not know his vaccine status should receive 1 dose of Tdap. This initial dose should be followed by tetanus and diphtheria toxoids (Td) booster doses every 10 years. Adults with an unknown or incomplete history of completing a 3-dose immunization series with Td-containing vaccines should begin or complete a primary immunization series including a Tdap dose. Adults should receive a Td booster every 10 years.  Varicella vaccine. An adult without evidence of immunity to varicella should receive 2 doses or a second dose if he has previously received 1 dose.  Human papillomavirus (HPV) vaccine. Males aged 11-21 years who have not received the vaccine previously should receive the 3-dose series. Males aged 22-26 years may be immunized. Immunization is recommended through the age of 67 years for any male who has sex with males and did not get any or all doses earlier. Immunization is recommended for any person with an immunocompromised condition through the age of 64 years if he did not get any or all doses earlier. During the 3-dose series, the second dose should be obtained 4-8 weeks after the first dose. The third dose should be obtained 24 weeks after the first dose and 16 weeks after the second dose.  Zoster vaccine. One dose is recommended for adults aged 2 years or older unless certain conditions are present.  Measles, mumps, and rubella (MMR) vaccine. Adults born before [birth year] generally are considered immune to measles and mumps. Adults born in [birth year] or later should have 1 or more doses of  MMR vaccine unless there is a contraindication to the vaccine or there is laboratory evidence of immunity to each of the three diseases. A routine second dose of MMR vaccine should be obtained at least 28 days after the first dose for students attending postsecondary schools, health care workers, or international travelers. People who received inactivated measles vaccine or an unknown type of measles vaccine during 1963-1967 should receive 2 doses of MMR vaccine. People who received inactivated mumps vaccine or an unknown type of mumps vaccine before 1979 and are at high risk for mumps infection should consider immunization with 2 doses of MMR vaccine. Unvaccinated health care workers born before [birth year] who lack laboratory evidence of measles, mumps, or rubella immunity or laboratory confirmation of disease should consider measles and mumps immunization with 2 doses of MMR vaccine or rubella immunization with 1 dose of MMR vaccine.  Pneumococcal 13-valent conjugate (PCV13) vaccine. When indicated, a person who is uncertain of his immunization history and has no record of immunization should receive the PCV13 vaccine. All adults 53 years of age and older should receive this vaccine. An adult aged 12 years or older who has certain medical conditions and has not been previously immunized should receive 1 dose of PCV13 vaccine. This PCV13 should be followed with a dose of pneumococcal polysaccharide (PPSV23) vaccine. Adults who are at high risk for pneumococcal disease should obtain the PPSV23 vaccine at least 8 weeks after the dose of PCV13 vaccine. Adults older than 58 years of age who have normal immune system function should obtain the PPSV23 vaccine dose at least 1 year after the dose of PCV13 vaccine.  Pneumococcal polysaccharide (PPSV23) vaccine. When PCV13 is also indicated, PCV13 should be obtained first. All adults aged 13 years and older should be immunized. An adult younger than age 22 years who has  certain  medical conditions should be immunized. Any person who resides in a nursing home or long-term care facility should be immunized. An adult smoker should be immunized. People with an immunocompromised condition and certain other conditions should receive both PCV13 and PPSV23 vaccines. People with human immunodeficiency virus (HIV) infection should be immunized as soon as possible after diagnosis. Immunization during chemotherapy or radiation therapy should be avoided. Routine use of PPSV23 vaccine is not recommended for American Indians, [place] Natives, or people younger than 65 years unless there are medical conditions that require PPSV23 vaccine. When indicated, people who have unknown immunization and have no record of immunization should receive PPSV23 vaccine. One-time revaccination 5 years after the first dose of PPSV23 is recommended for people aged 19-64 years who have chronic kidney failure, nephrotic syndrome, asplenia, or immunocompromised conditions. People who received 1-2 doses of PPSV23 before age 79 years should receive another dose of PPSV23 vaccine at age 60 years or later if at least 5 years have passed since the previous dose. Doses of PPSV23 are not needed for people immunized with PPSV23 at or after age 3 years.  Meningococcal vaccine. Adults with asplenia or persistent complement component deficiencies should receive 2 doses of quadrivalent meningococcal conjugate (MenACWY-D) vaccine. The doses should be obtained at least 2 months apart. Microbiologists working with certain meningococcal bacteria, [place] recruits, people at risk during an outbreak, and people who travel to or live in countries with a high rate of meningitis should be immunized. A first-year college student up through age 62 years who is living in a residence hall should receive a dose if he did not receive a dose on or after his 16th birthday. Adults who have certain high-risk conditions should receive one or more  doses of vaccine.  Hepatitis A vaccine. Adults who wish to be protected from this disease, have chronic liver disease, work with hepatitis A-infected animals, work in hepatitis A research labs, or travel to or work in countries with a high rate of hepatitis A should be immunized. Adults who were previously unvaccinated and who anticipate close contact with an international adoptee during the first 60 days after arrival in the [place] from a country with a high rate of hepatitis A should be immunized.  Hepatitis B vaccine. Adults should be immunized if they wish to be protected from this disease, are under age 63 years and have diabetes, have chronic liver disease, have had more than one sex partner in the past 6 months, may be exposed to blood or other infectious body fluids, are household contacts or sex partners of hepatitis B positive people, are clients or workers in certain care facilities, or travel to or work in countries with a high rate of hepatitis B.  Haemophilus influenzae type b (Hib) vaccine. A previously unvaccinated person with asplenia or sickle cell disease or having a scheduled splenectomy should receive 1 dose of Hib vaccine. Regardless of previous immunization, a recipient of a hematopoietic stem cell transplant should receive a 3-dose series 6-12 months after his successful transplant. Hib vaccine is not recommended for adults with HIV infection. Preventive Service / Frequency Ages 29 to 66  Blood pressure check.** / Every 3-5 years.  Lipid and cholesterol check.** / Every 5 years beginning at age 38.  Hepatitis C blood test.** / For any individual with known risks for hepatitis C.  Skin self-exam. / Monthly.  Influenza vaccine. / Every year.  Tetanus, diphtheria, and acellular pertussis (Tdap, Td) vaccine.** / Consult your health  care provider. 1 dose of Td every 10 years.  Varicella vaccine.** / Consult your health care provider.  HPV vaccine. / 3 doses over 6  months, if 78 or younger.  Measles, mumps, rubella (MMR) vaccine.** / You need at least 1 dose of MMR if you were born in 1957 or later. You may also need a second dose.  Pneumococcal 13-valent conjugate (PCV13) vaccine.** / Consult your health care provider.  Pneumococcal polysaccharide (PPSV23) vaccine.** / 1 to 2 doses if you smoke cigarettes or if you have certain conditions.  Meningococcal vaccine.** / 1 dose if you are age 61 to 77 years and a Market researcher living in a residence hall, or have one of several medical conditions. You may also need additional booster doses.  Hepatitis A vaccine.** / Consult your health care provider.  Hepatitis B vaccine.** / Consult your health care provider.  Haemophilus influenzae type b (Hib) vaccine.** / Consult your health care provider. Ages 28 to 75  Blood pressure check.** / Every year.  Lipid and cholesterol check.** / Every 5 years beginning at age 74.  Lung cancer screening. / Every year if you are aged 24-80 years and have a 30-pack-year history of smoking and currently smoke or have quit within the past 15 years. Yearly screening is stopped once you have quit smoking for at least 15 years or develop a health problem that would prevent you from having lung cancer treatment.  Fecal occult blood test (FOBT) of stool. / Every year beginning at age 107 and continuing until age 48. You may not have to do this test if you get a colonoscopy every 10 years.  Flexible sigmoidoscopy** or colonoscopy.** / Every 5 years for a flexible sigmoidoscopy or every 10 years for a colonoscopy beginning at age 62 and continuing until age 41.  Hepatitis C blood test.** / For all people born from [birth year] through 1965 and any individual with known risks for hepatitis C.  Skin self-exam. / Monthly.  Influenza vaccine. / Every year.  Tetanus, diphtheria, and acellular pertussis (Tdap/Td) vaccine.** / Consult your health care provider. 1 dose of Td every  10 years.  Varicella vaccine.** / Consult your health care provider.  Zoster vaccine.** / 1 dose for adults aged 32 years or older.  Measles, mumps, rubella (MMR) vaccine.** / You need at least 1 dose of MMR if you were born in 1957 or later. You may also need a second dose.  Pneumococcal 13-valent conjugate (PCV13) vaccine.** / Consult your health care provider.  Pneumococcal polysaccharide (PPSV23) vaccine.** / 1 to 2 doses if you smoke cigarettes or if you have certain conditions.  Meningococcal vaccine.** / Consult your health care provider.  Hepatitis A vaccine.** / Consult your health care provider.  Hepatitis B vaccine.** / Consult your health care provider.  Haemophilus influenzae type b (Hib) vaccine.** / Consult your health care provider. Ages 88 and over  Blood pressure check.** / Every year.  Lipid and cholesterol check.**/ Every 5 years beginning at age 7.  Lung cancer screening. / Every year if you are aged 65-80 years and have a 30-pack-year history of smoking and currently smoke or have quit within the past 15 years. Yearly screening is stopped once you have quit smoking for at least 15 years or develop a health problem that would prevent you from having lung cancer treatment.  Fecal occult blood test (FOBT) of stool. / Every year beginning at age 80 and continuing until age 48. You may not  have to do this test if you get a colonoscopy every 10 years.  Flexible sigmoidoscopy** or colonoscopy.** / Every 5 years for a flexible sigmoidoscopy or every 10 years for a colonoscopy beginning at age 71 and continuing until age 51.  Hepatitis C blood test.** / For all people born from [birth year] through 1964 and any individual with known risks for hepatitis C.  Abdominal aortic aneurysm (AAA) screening.** / A one-time screening for ages 9 to 66 years who are current or former smokers.  Skin self-exam. / Monthly.  Influenza vaccine. / Every year.  Tetanus, diphtheria, and  acellular pertussis (Tdap/Td) vaccine.** / 1 dose of Td every 10 years.  Varicella vaccine.** / Consult your health care provider.  Zoster vaccine.** / 1 dose for adults aged 43 years or older.  Pneumococcal 13-valent conjugate (PCV13) vaccine.** / 1 dose for all adults aged 77 years and older.  Pneumococcal polysaccharide (PPSV23) vaccine.** / 1 dose for all adults aged 50 years and older.  Meningococcal vaccine.** / Consult your health care provider.  Hepatitis A vaccine.** / Consult your health care provider.  Hepatitis B vaccine.** / Consult your health care provider.  Haemophilus influenzae type b (Hib) vaccine.** / Consult your health care provider. **Family history and personal history of risk and conditions may change your health care provider's recommendations.   This information is not intended to replace advice given to you by your health care provider. Make sure you discuss any questions you have with your health care provider.   Document Released: 11/21/2001 Document Revised: 10/16/2014 Document Reviewed: 02/20/2011 Elsevier Interactive Patient Education [employer].   Your goal blood pressure should be 135/85 or less on a regular basis, her medications should be started.  Normal blood pressure is 120/80 or less.    Hypertension Hypertension, commonly called high blood pressure, is when the force of blood pumping through the arteries is too strong. The arteries are the blood vessels that carry blood from the heart throughout the body. Hypertension forces the heart to work harder to pump blood and may cause arteries to become narrow or stiff. Having untreated or uncontrolled hypertension can cause heart attacks, strokes, kidney disease, and other problems. A blood pressure reading consists of a higher number over a lower number. Ideally, your blood pressure should be below 120/80. The first ("top") number is called the systolic pressure. It is a measure of the  pressure in your arteries as your heart beats. The second ("bottom") number is called the diastolic pressure. It is a measure of the pressure in your arteries as the heart relaxes. What are the causes? The cause of this condition is not known. What increases the risk? Some risk factors for high blood pressure are under your control. Others are not. Factors you can change  Smoking.  Having type 2 diabetes mellitus, high cholesterol, or both.  Not getting enough exercise or physical activity.  Being overweight.  Having too much fat, sugar, calories, or salt (sodium) in your diet.  Drinking too much alcohol. Factors that are difficult or impossible to change  Having chronic kidney disease.  Having a family history of high blood pressure.  Age. Risk increases with age.  Race. You may be at higher risk if you are African-American.  Gender. Men are at higher risk than women before age 66. After age 27, women are at higher risk than men.  Having obstructive sleep apnea.  Stress. What are the signs or symptoms? Extremely high blood pressure (hypertensive  crisis) may cause:  Headache.  Anxiety.  Shortness of breath.  Nosebleed.  Nausea and vomiting.  Severe chest pain.  Jerky movements you cannot control (seizures).  How is this diagnosed? This condition is diagnosed by measuring your blood pressure while you are seated, with your arm resting on a surface. The cuff of the blood pressure monitor will be placed directly against the skin of your upper arm at the level of your heart. It should be measured at least twice using the same arm. Certain conditions can cause a difference in blood pressure between your right and left arms. Certain factors can cause blood pressure readings to be lower or higher than normal (elevated) for a short period of time:  When your blood pressure is higher when you are in a health care provider's office than when you are at home, this is called  white coat hypertension. Most people with this condition do not need medicines.  When your blood pressure is higher at home than when you are in a health care provider's office, this is called masked hypertension. Most people with this condition may need medicines to control blood pressure.  If you have a high blood pressure reading during one visit or you have normal blood pressure with other risk factors:  You may be asked to return on a different day to have your blood pressure checked again.  You may be asked to monitor your blood pressure at home for 1 week or longer.  If you are diagnosed with hypertension, you may have other blood or imaging tests to help your health care provider understand your overall risk for other conditions. How is this treated? This condition is treated by making healthy lifestyle changes, such as eating healthy foods, exercising more, and reducing your alcohol intake. Your health care provider may prescribe medicine if lifestyle changes are not enough to get your blood pressure under control, and if:  Your systolic blood pressure is above 130.  Your diastolic blood pressure is above 80.  Your personal target blood pressure may vary depending on your medical conditions, your age, and other factors. Follow these instructions at home: Eating and drinking  Eat a diet that is high in fiber and potassium, and low in sodium, added sugar, and fat. An example eating plan is called the DASH (Dietary Approaches to Stop Hypertension) diet. To eat this way: ? Eat plenty of fresh fruits and vegetables. Try to fill half of your plate at each meal with fruits and vegetables. ? Eat whole grains, such as whole wheat pasta, brown rice, or whole grain bread. Fill about one quarter of your plate with whole grains. ? Eat or drink low-fat dairy products, such as skim milk or low-fat yogurt. ? Avoid fatty cuts of meat, processed or cured meats, and poultry with skin. Fill about one  quarter of your plate with lean proteins, such as fish, chicken without skin, beans, eggs, and tofu. ? Avoid premade and processed foods. These tend to be higher in sodium, added sugar, and fat.  Reduce your daily sodium intake. Most people with hypertension should eat less than 1,500 mg of sodium a day.  Limit alcohol intake to no more than 1 drink a day for nonpregnant women and 2 drinks a day for men. One drink equals 12 oz of beer, 5 oz of wine, or 1 oz of hard liquor. Lifestyle  Work with your health care provider to maintain a healthy body weight or to lose weight. Ask  what an ideal weight is for you.  Get at least 30 minutes of exercise that causes your heart to beat faster (aerobic exercise) most days of the week. Activities may include walking, swimming, or biking.  Include exercise to strengthen your muscles (resistance exercise), such as pilates or lifting weights, as part of your weekly exercise routine. Try to do these types of exercises for 30 minutes at least 3 days a week.  Do not use any products that contain nicotine or tobacco, such as cigarettes and e-cigarettes. If you need help quitting, ask your health care provider.  Monitor your blood pressure at home as told by your health care provider.  Keep all follow-up visits as told by your health care provider. This is important. Medicines  Take over-the-counter and prescription medicines only as told by your health care provider. Follow directions carefully. Blood pressure medicines must be taken as prescribed.  Do not skip doses of blood pressure medicine. Doing this puts you at risk for problems and can make the medicine less effective.  Ask your health care provider about side effects or reactions to medicines that you should watch for. Contact a health care provider if:  You think you are having a reaction to a medicine you are taking.  You have headaches that keep coming back (recurring).  You feel dizzy.  You  have swelling in your ankles.  You have trouble with your vision. Get help right away if:  You develop a severe headache or confusion.  You have unusual weakness or numbness.  You feel faint.  You have severe pain in your chest or abdomen.  You vomit repeatedly.  You have trouble breathing. Summary  Hypertension is when the force of blood pumping through your arteries is too strong. If this condition is not controlled, it may put you at risk for serious complications.  Your personal target blood pressure may vary depending on your medical conditions, your age, and other factors. For most people, a normal blood pressure is less than 120/80.  Hypertension is treated with lifestyle changes, medicines, or a combination of both. Lifestyle changes include weight loss, eating a healthy, low-sodium diet, exercising more, and limiting alcohol. This information is not intended to replace advice given to you by your health care provider. Make sure you discuss any questions you have with your health care provider. Document Released: 09/25/2005 Document Revised: 08/23/2016 Document Reviewed: 08/23/2016 Elsevier Interactive Patient Education  2018 [employer].    How to Take Your Blood Pressure   Blood pressure is a measurement of how strongly your blood is pressing against the walls of your arteries. Arteries are blood vessels that carry blood from your heart throughout your body. Your health care provider takes your blood pressure at each office visit. You can also take your own blood pressure at home with a blood pressure machine. You may need to take your own blood pressure:  To confirm a diagnosis of high blood pressure (hypertension).  To monitor your blood pressure over time.  To make sure your blood pressure medicine is working.  Supplies needed: To take your blood pressure, you will need a blood pressure machine. You can buy a blood pressure machine, or blood pressure monitor,  at most drugstores or online. There are several types of home blood pressure monitors. When choosing one, consider the following:  Choose a monitor that has an arm cuff.  Choose a monitor that wraps snugly around your upper arm. You should be able to  fit only one finger between your arm and the cuff.  Do not choose a monitor that measures your blood pressure from your wrist or finger.  Your health care provider can suggest a reliable monitor that will meet your needs. How to prepare To get the most accurate reading, avoid the following for 30 minutes before you check your blood pressure:  Drinking caffeine.  Drinking alcohol.  Eating.  Smoking.  Exercising.  Five minutes before you check your blood pressure:  Empty your bladder.  Sit quietly without talking in a dining chair, rather than in a soft couch or armchair.  How to take your blood pressure To check your blood pressure, follow the instructions in the manual that came with your blood pressure monitor. If you have a digital blood pressure monitor, the instructions may be as follows: 1. Sit up straight. 2. Place your feet on the floor. Do not cross your ankles or legs. 3. Rest your left arm at the level of your heart on a table or desk or on the arm of a chair. 4. Pull up your shirt sleeve. 5. Wrap the blood pressure cuff around the upper part of your left arm, 1 inch (2.5 cm) above your elbow. It is best to wrap the cuff around bare skin. 6. Fit the cuff snugly around your arm. You should be able to place only one finger between the cuff and your arm. 7. Position the cord inside the groove of your elbow. 8. Press the power button. 9. Sit quietly while the cuff inflates and deflates. 10. Read the digital reading on the monitor screen and write it down (record it). 11. Wait 2-3 minutes, then repeat the steps, starting at step 1.  What does my blood pressure reading mean? A blood pressure reading consists of a higher  number over a lower number. Ideally, your blood pressure should be below 120/80. The first ("top") number is called the systolic pressure. It is a measure of the pressure in your arteries as your heart beats. The second ("bottom") number is called the diastolic pressure. It is a measure of the pressure in your arteries as the heart relaxes. Blood pressure is classified into four stages. The following are the stages for adults who do not have a short-term serious illness or a chronic condition. Systolic pressure and diastolic pressure are measured in a unit called mm Hg. Normal  Systolic pressure: below 785.  Diastolic pressure: below 80. Elevated  Systolic pressure: 885-027.  Diastolic pressure: below 80. Hypertension stage 1  Systolic pressure: 741-287.  Diastolic pressure: 86-76. Hypertension stage 2  Systolic pressure: 720 or above.  Diastolic pressure: 90 or above. You can have prehypertension or hypertension even if only the systolic or only the diastolic number in your reading is higher than normal. Follow these instructions at home:  Check your blood pressure as often as recommended by your health care provider.  Take your monitor to the next appointment with your health care provider to make sure: ? That you are using it correctly. ? That it provides accurate readings.  Be sure you understand what your goal blood pressure numbers are.  Tell your health care provider if you are having any side effects from blood pressure medicine. Contact a health care provider if:  Your blood pressure is consistently high. Get help right away if:  Your systolic blood pressure is higher than 180.  Your diastolic blood pressure is higher than 110. This information is not intended to replace  advice given to you by your health care provider. Make sure you discuss any questions you have with your health care provider. Document Released: 03/03/2016 Document Revised: 05/16/2016 Document  Reviewed: 03/03/2016 Elsevier Interactive Patient Education  [employer].

## 2020-01-19 NOTE — Progress Notes (Signed)
Male physical  Impression and Recommendations:    1. Encounter for wellness examination in adult   2. Need for shingles vaccine   3. Essential hypertension   4. Elevated LDL cholesterol level   5. Screening for colon cancer   6. Gastroesophageal reflux disease with esophagitis, unspecified whether hemorrhage   7. Right lower arm weakness/ pain at times     Of note, this is my first time meeting patient.  Patient is new to me and was previously being cared for at our office by an NP, who no longer works at [facility].   Will be seen by Mario Reid, PA-C in future.   GERD - Begin famotidine/pepcid AC over the counter to control heartburn. - Advised patient to avoid eating within three hours of lying down. - Discussed that heavy use of caffeine and ongoing cigarette smoking will contribute to heartburn. - Advised patient to avoid use of caffeine past 3 PM, and avoid smoking prior to bedtime.   Right Arm Pain - Patient will return in near future for assessment of his arm/ hand if desired. Notified CPE today and rtc if desired for additional concerns   1) Anticipatory Guidance: Discussed skin CA prevention and sunscreen when outside along with skin surveillance; eating a balanced and modest diet; physical activity at least 25 minutes per day or minimum of 150 min/ week moderate to intense activity.  - Encouraged patient to check his blood pressure at home, at least three times per week.  Discussed importance of appropriate blood pressure monitoring with patient during appointment today.  - Patient knows that if he wishes to quit smoking, assistance is available to him.  - Discussed prudent self-testicular screenings with patient during appointment today.   2) Immunizations / Screenings / Labs:   All immunizations are up-to-date per recommendations or will be updated today if pt allows.    - Patient understands with dental and vision screens they will  schedule independently.  - Will obtain CBC, CMP, HgA1c, Lipid panel, TSH and vit D when fasting, if not already done past 12 mo/ recently   - Per patient, believes his last colonoscopy was 4-5 years ago, with repeat recommended in 10 years.  - Per patient, obtained the [employer] COVID-19 vaccine between 2-4 weeks ago.  - Patient has obtained first dose of shingles vaccine.  Need for second dose today.   3) Weight - Body mass index is 32.93 kg/m:   BMI meaning discussed with patient.  Discussed goal to improve diet habits to improve overall feelings of well being and objective health data. Improve nutrient density of diet through increasing intake of fruits and vegetables and decreasing saturated fats, white flour products and refined sugars.   American Heart Association guidelines for healthy diet, basically Mediterranean diet, and exercise guidelines of 30 minutes 5 days per week or more discussed in detail.  Health counseling performed.  All questions answered.   4) Health Counseling & Preventative Maintenance: - Advised patient to continue working toward exercising to improve overall mental, physical, and emotional health.    - Reviewed the "spokes of the wheel" of mood and wellbeing.  Stressed the importance of ongoing prudent habits, including regular exercise, appropriate sleep hygiene, healthful dietary habits, and prayer/meditation to relax.  - Encouraged patient to engage in daily physical activity as tolerated, especially a formal exercise routine.  Recommended that the patient eventually strive for at least 150 minutes of moderate  cardiovascular activity per week according to guidelines established by the [facility].   - Healthy dietary habits encouraged, including low-carb, and high amounts of lean protein in diet.   - Patient should also consume adequate amounts of water.   Orders Placed This Encounter  Procedures  . Varicella-zoster vaccine IM  . CBC  . Comprehensive  metabolic panel    Order Specific Question:   Has the patient fasted?    Answer:   Yes  . TSH  . T4, free  . Hemoglobin A1c  . Lipid panel    Order Specific Question:   Has the patient fasted?    Answer:   Yes  . VITAMIN D 25 Hydroxy (Vit-D Deficiency, Fractures)  . POC Hemoccult Bld/Stl (1-Cd Office Dx)    Return for f/up 2 months for progress on famotidine OTC with heartburn, arm pain, sooner if needed.    Reminded pt important of f-up preventative CPE in 1 year.  Reminded pt again, this is in addition to any chronic care visits.    Gross side effects, risk and benefits, and alternatives of medications discussed with patient.  Patient is aware that all medications have potential side effects and we are unable to predict every side effect or drug-drug interaction that may occur.  Expresses verbal understanding and consents to current therapy plan and treatment regimen.  Please see AVS handed out to patient at the end of our visit for further patient instructions/ counseling done pertaining to today's office visit.  This document serves as a record of services personally performed by Mellody Dance, DO. It was created on her behalf by Toni Amend, a trained medical scribe. The creation of this record is based on the scribe's personal observations and the provider's statements to them.    The above documentation from Toni Amend, medical scribe, has been reviewed by Marjory Sneddon, D.O.   Subjective:     I, Toni Amend, am serving as Education administrator for [name].   CC: CPE   HPI: Mario Newman is a 59 y.o. male who presents to [place] at [facility] today for a yearly health maintenance exam.     Health Maintenance Summary  - Reviewed and updated, unless pt declines services.  Last Cologuard or Colonoscopy:  Believes his last colonoscopy was 4-5 years ago, with repeat recommended in 10 years. Family history of Colon CA:  None reported.   Tobacco History Reviewed:  Current every day smoker, has smoked 3/4 ppd for about 30 years, for a 28 pack-year history.  Notes he's smoked since about age 15, and quit for 3 years at one point.  Alcohol / drug use:    No concerns, no excessive use / no use Exercise Habits:  Not meeting AHA guidelines.  He does not exercise regularly. Dental Home:  He does not go every six months, but tries to go yearly.  Notes his schedule is off due to the pandemic.   Eye exams:  Goes once yearly for his prescription. Dermatology home:  Denies concerns, but states his wife has noticed an area on his back.  States "it doesn't give me any pain, but it just bothers her."  Says he has never seen it himself, but it causes his wife to be concerned.   Male history: STD concerns:  None, monogamous Birth control method:   n/a Additional penile/ urinary concerns:  No.   Additional concerns beyond Health Maintenance issues:     Notes his blood pressure is  usually controlled in the doctor's office.  He does not check his blood pressure at home.  He gets up at 4-4:30 AM and drinks a whole pot of coffee before work.  Regarding the coffee, says "I try to make it weak" esp since I told him caffeine makes GERD much W  Has been having a lot of problems with heartburn over the last 3-4 months.  W lying down and after certain foods.  No exertional component  Says when the pandemic first happened, one arm  was "getting really weak," and he felt concerned about this.  Denies numbness or tingling in the hand. States it never bothered him enough to get it checked out at the doctors.     Immunization History  Administered Date(s) Administered  . Influenza,inj,Quad PF,6+ Mos 10/08/2017, 12/16/2018, 07/28/2019  . Janssen (J&J) SARS-COV-2 Vaccination 12/20/2019  . Tdap 12/16/2018  . Zoster Recombinat (Shingrix) 07/28/2019, 01/19/2020     Health Maintenance  Topic Date Due  . INFLUENZA VACCINE  05/09/2020  .  COLONOSCOPY  02/06/2025  . TETANUS/TDAP  12/15/2028  . Hepatitis C Screening  Completed  . HIV Screening  Completed       Wt Readings from Last 3 Encounters:  01/19/20 216 lb 9.6 oz (98.2 kg)  10/28/19 208 lb (94.3 kg)  07/28/19 210 lb 6.4 oz (95.4 kg)   BP Readings from Last 3 Encounters:  01/19/20 124/80  07/28/19 136/81  12/16/18 131/80   Pulse Readings from Last 3 Encounters:  01/19/20 95  07/28/19 69  12/16/18 76    Patient Active Problem List   Diagnosis Date Noted  . Gastroesophageal reflux disease with esophagitis 01/19/2020  . Right lower arm weakness/ pain at times 01/19/2020  . Cervical pain (neck) 07/28/2019  . Chest pain 02/18/2018  . Dyspnea 02/18/2018  . Nocturia 11/20/2017  . 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 Strain of back 11/20/2017  . Elevated LDL cholesterol level 11/20/2017  . Essential hypertension 09/04/2017  . Healthcare maintenance 09/04/2017  . Tobacco use 09/04/2017  . Depression, recurrent ([place]) 09/04/2017    History reviewed. No pertinent past medical history.  History reviewed. No pertinent surgical history.  Family History  Problem Relation Age of Onset  . Hypertension Mother   . Diabetes Father   . Alcohol abuse Father   . Healthy Sister     Social History   Substance and Sexual Activity  Drug Use No  ,  Social History   Substance and Sexual Activity  Alcohol Use No  ,  Social History   Tobacco Use  Smoking Status Current Every Day Smoker  . Packs/day: 1.00  . Years: 20.00  . Pack years: 20.00  . Types: Cigarettes  Smokeless Tobacco Never Used  ,  Social History   Substance and Sexual Activity  Sexual Activity Yes  . Birth control/protection: None    Patient's Medications  New Prescriptions   No medications on file  Previous Medications   ATORVASTATIN (LIPITOR) 40 MG TABLET    TAKE 1 TABLET DAILY AT 6 PM. OFFICE VISIT REQUIRED PRIOR TO ANY FURTHER REFILLS   BUPROPION (WELLBUTRIN SR) 150 MG 12 HR  TABLET    First three days 1 tab daily, then increase to 1 tab twice daily- hold at this dose.   CITALOPRAM (CELEXA) 40 MG TABLET    Take 1 tablet (40 mg total) by mouth daily. OFFICE VISIT REQUIRED PRIOR TO ANY FURTHER REFILLS   LISINOPRIL (ZESTRIL) 10 MG TABLET  1 tablet by mouth  Modified Medications   No medications on file  Discontinued Medications   No medications on file    Patient has no known allergies.  Review of Systems: General:   Denies fever, chills, unexplained weight loss.  Optho/Auditory:   Denies visual changes, blurred vision/LOV Respiratory:   Denies SOB, DOE more than baseline levels.   Cardiovascular:   Denies chest pain, palpitations, new onset peripheral edema  Gastrointestinal:   Denies nausea, vomiting, diarrhea.  Genitourinary: Denies dysuria, freq/ urgency, flank pain or discharge from genitals.  Endocrine:     Denies hot or cold intolerance, polyuria, polydipsia. Musculoskeletal:   Denies unexplained myalgias, joint swelling, unexplained arthralgias, gait problems.  Skin:  Denies rash, suspicious lesions Neurological:     Denies dizziness, unexplained weakness, numbness  Psychiatric/Behavioral:   Denies mood changes, suicidal or homicidal ideations, hallucinations    Objective:     Blood pressure 124/80, pulse 95, temperature 98.1 F (36.7 C), temperature source Oral, resp. rate 12, height 5\' 8"  (1.727 m), weight 216 lb 9.6 oz (98.2 kg), SpO2 95 %. Body mass index is 32.93 kg/m. General Appearance:    Alert, cooperative, no distress, appears stated age  Head:    Normocephalic, without obvious abnormality, atraumatic  Eyes:    PERRL, conjunctiva/corneas clear, EOM's intact, fundi    benign, both eyes  Ears:    Normal TM's and external ear canals, both ears  Nose:   Nares normal, septum midline, mucosa normal, no drainage    or sinus tenderness  Throat:   Lips w/o lesion, mucosa moist, and tongue normal; teeth and   gums normal  Neck:   Supple,  symmetrical, trachea midline, no adenopathy;    thyroid:  no enlargement/tenderness/nodules; no carotid   bruit or JVD  Back:     Symmetric, no curvature, ROM normal, no CVA tenderness  Lungs:     Clear to auscultation bilaterally, respirations unlabored, no       Wh/ R/ R  Chest Wall:    No tenderness or gross deformity; normal excursion   Heart:    Regular rate and rhythm, S1 and S2 normal, no murmur, rub   or gallop  Abdomen:     Soft, non-tender, bowel sounds active all four quadrants, NO   G/R/R, no masses, no organomegaly  Genitalia:    Ext genitalia: without lesion, no penile rash or discharge, no hernias appreciated   Rectal:    Normal tone, prostate WNL's and equal b/l, no tenderness; guaiac negative stool  Extremities:   Extremities normal, atraumatic, no cyanosis or gross edema  Pulses:   2+ and symmetric all extremities  Skin:   approximately 3 mm by 2 mm oval-shaped sebaceous cyst in the T-10 paraspinal region on right.  Skin otherwise warm, dry, Skin color, texture, turgor normal, no obvious rashes or lesions  M-Sk:   Ambulates * 4 w/o difficulty, no gross deformities, tone WNL  Neurologic:   CNII-XII intact, normal strength, sensation and reflexes    Throughout Psych:  No HI/SI, judgement and insight good, Euthymic mood. Full Affect.

## 2020-01-20 LAB — LIPID PANEL
Chol/HDL Ratio: 2.9 ratio (ref 0.0–5.0)
Cholesterol, Total: 114 mg/dL (ref 100–199)
HDL: 40 mg/dL (ref >39)
LDL Chol Calc (NIH): 57 mg/dL (ref 0–99)
Triglycerides: 89 mg/dL (ref 0–149)
VLDL Cholesterol Cal: 17 mg/dL (ref 5–40)

## 2020-01-20 LAB — CBC
Hematocrit: 43.1 % (ref 37.5–51.0)
Hemoglobin: 14 g/dL (ref 13.0–17.7)
MCH: 24.5 pg — ABNORMAL LOW (ref 26.6–33.0)
MCHC: 32.5 g/dL (ref 31.5–35.7)
MCV: 76 fL — ABNORMAL LOW (ref 79–97)
Platelets: 279 10*3/uL (ref 150–450)
RBC: 5.71 x10E6/uL (ref 4.14–5.80)
RDW: 16 % — ABNORMAL HIGH (ref 11.6–15.4)
WBC: 7.3 10*3/uL (ref 3.4–10.8)

## 2020-01-20 LAB — COMPREHENSIVE METABOLIC PANEL
ALT: 27 IU/L (ref 0–44)
AST: 28 IU/L (ref 0–40)
Albumin/Globulin Ratio: 1.6 (ref 1.2–2.2)
Albumin: 4.4 g/dL (ref 3.8–4.9)
Alkaline Phosphatase: 84 IU/L (ref 39–117)
BUN/Creatinine Ratio: 9 (ref 9–20)
BUN: 10 mg/dL (ref 6–24)
Bilirubin Total: 0.4 mg/dL (ref 0.0–1.2)
CO2: 19 mmol/L — ABNORMAL LOW (ref 20–29)
Calcium: 9.7 mg/dL (ref 8.7–10.2)
Chloride: 103 mmol/L (ref 96–106)
Creatinine, Ser: 1.06 mg/dL (ref 0.76–1.27)
GFR calc Af Amer: 89 mL/min/{1.73_m2} (ref >59)
GFR calc non Af Amer: 77 mL/min/{1.73_m2} (ref >59)
Globulin, Total: 2.7 g/dL (ref 1.5–4.5)
Glucose: 95 mg/dL (ref 65–99)
Potassium: 4.3 mmol/L (ref 3.5–5.2)
Sodium: 137 mmol/L (ref 134–144)
Total Protein: 7.1 g/dL (ref 6.0–8.5)

## 2020-01-20 LAB — HEMOGLOBIN A1C
Est. average glucose Bld gHb Est-mCnc: 134 mg/dL
Hgb A1c MFr Bld: 6.3 % — ABNORMAL HIGH (ref 4.8–5.6)

## 2020-01-20 LAB — T4, FREE: Free T4: 1.09 ng/dL (ref 0.82–1.77)

## 2020-01-20 LAB — TSH: TSH: 0.7 u[IU]/mL (ref 0.450–4.500)

## 2020-01-20 LAB — VITAMIN D 25 HYDROXY (VIT D DEFICIENCY, FRACTURES): Vit D, 25-Hydroxy: 47.3 ng/mL (ref 30.0–100.0)

## 2020-01-22 ENCOUNTER — Other Ambulatory Visit: Payer: Self-pay | Admitting: Adult Health

## 2020-01-26 ENCOUNTER — Other Ambulatory Visit: Payer: Self-pay | Admitting: Adult Health

## 2020-01-27 ENCOUNTER — Other Ambulatory Visit: Payer: Self-pay | Admitting: Adult Health

## 2020-02-16 ENCOUNTER — Telehealth: Payer: Self-pay | Admitting: Physician Assistant

## 2020-02-16 ENCOUNTER — Other Ambulatory Visit: Payer: Self-pay | Admitting: Physician Assistant

## 2020-02-16 NOTE — Telephone Encounter (Signed)
Patient called saying pharm did not receive refill order from [place] on his lisinopril and atorvastatin, can we verify if this is the case as it seems the clinic staff did place this order correctly at patient's last OV.

## 2020-02-16 NOTE — Telephone Encounter (Signed)
RX called into pharmacy. AS, CMA

## 2020-03-25 ENCOUNTER — Other Ambulatory Visit: Payer: Self-pay

## 2020-03-25 ENCOUNTER — Encounter: Payer: Self-pay | Admitting: Physician Assistant

## 2020-03-25 ENCOUNTER — Ambulatory Visit (INDEPENDENT_AMBULATORY_CARE_PROVIDER_SITE_OTHER): Payer: Managed Care, Other (non HMO) | Admitting: Physician Assistant

## 2020-03-25 VITALS — BP 132/85 | HR 94 | Temp 97.9°F | Ht 68.0 in | Wt 202.4 lb

## 2020-03-25 DIAGNOSIS — F339 Major depressive disorder, recurrent, unspecified: Secondary | ICD-10-CM

## 2020-03-25 DIAGNOSIS — R0981 Nasal congestion: Secondary | ICD-10-CM | POA: Diagnosis not present

## 2020-03-25 DIAGNOSIS — I10 Essential (primary) hypertension: Secondary | ICD-10-CM | POA: Diagnosis not present

## 2020-03-25 DIAGNOSIS — K2100 Gastro-esophageal reflux disease with esophagitis, without bleeding: Secondary | ICD-10-CM | POA: Diagnosis not present

## 2020-03-25 NOTE — Assessment & Plan Note (Addendum)
-   Stable - Advised patient to take famotidine daily for 2-3 weeks and then can take as needed to help reduce flare-ups.  - Will continue to monitor.

## 2020-03-25 NOTE — Assessment & Plan Note (Signed)
-   BP today is 132/85 HR 94, stable - Continue Lisinopril 10 mg - Encourage ambulatory BP and pulse monitoring, especially when not feeling well. - Continue DASH diet. - Encourage to stay as active as possible.

## 2020-03-25 NOTE — Patient Instructions (Signed)
Flonase nasal spray- 1-2 sprays into each nostril before bedtime   Heartburn Heartburn is a type of pain or discomfort that can happen in the throat or chest. It is often described as a burning pain. It may also cause a bad, acid-like taste in the mouth. Heartburn may feel worse when you lie down or bend over, and it is often worse at night. Heartburn may be caused by stomach contents that move back up into the esophagus (reflux). Follow these instructions at home: Eating and drinking   Avoid certain foods and drinks as told by your health care provider. This may include: ? Coffee and tea (with or without caffeine). ? Drinks that contain alcohol. ? Energy drinks and sports drinks. ? Carbonated drinks or sodas. ? Chocolate and cocoa. ? Peppermint and mint flavorings. ? Garlic and onions. ? Horseradish. ? Spicy and acidic foods, including peppers, chili powder, curry powder, vinegar, hot sauces, and barbecue sauce. ? Citrus fruit juices and citrus fruits, such as oranges, lemons, and limes. ? Tomato-based foods, such as red sauce, chili, salsa, and pizza with red sauce. ? Fried and fatty foods, such as donuts, french fries, potato chips, and high-fat dressings. ? High-fat meats, such as hot dogs and fatty cuts of red and white meats, such as rib eye steak, sausage, ham, and bacon. ? High-fat dairy items, such as whole milk, butter, and cream cheese.  Eat small, frequent meals instead of large meals.  Avoid drinking large amounts of liquid with your meals.  Avoid eating meals during the 2-3 hours before bedtime.  Avoid lying down right after you eat.  Do not exercise right after you eat. Lifestyle      If you are overweight, reduce your weight to an amount that is healthy for you. Ask your health care provider for guidance about a safe weight loss goal.  Do not use any products that contain nicotine or tobacco, such as cigarettes, e-cigarettes, and chewing tobacco. These can  make your symptoms worse. If you need help quitting, ask your health care provider.  Wear loose-fitting clothing. Do not wear anything tight around your waist that causes pressure on your abdomen.  Raise (elevate) the head of your bed about 6 inches (15 cm) when you sleep.  Try to reduce your stress, such as with yoga or meditation. If you need help reducing stress, ask your health care provider. General instructions  Pay attention to any changes in your symptoms.  Take over-the-counter and prescription medicines only as told by your health care provider. ? Do not take aspirin, ibuprofen, or other NSAIDs unless your health care provider told you to do so. ? Stop medicines only as told by your health care provider. If you stop taking some medicines too quickly, your symptoms may get worse.  Keep all follow-up visits as told by your health care provider. This is important. Contact a health care provider if:  You have new symptoms.  You have unexplained weight loss.  You have difficulty swallowing, or it hurts to swallow.  You have wheezing or a persistent cough.  Your symptoms do not improve with treatment.  You have frequent heartburn for more than 2 weeks. Get help right away if:  You have pain in your arms, neck, jaw, teeth, or back.  You feel sweaty, dizzy, or light-headed.  You have chest pain or shortness of breath.  You vomit and your vomit looks like blood or coffee grounds.  Your stool is bloody or black. These  symptoms may represent a serious problem that is an emergency. Do not wait to see if the symptoms will go away. Get medical help right away. Call your local emergency services (911 in the [place] Do not drive yourself to the hospital. Summary  Heartburn is a type of pain or discomfort that can happen in the throat or chest. It is often described as a burning pain. It may also cause a bad, acid-like taste in the mouth.  Avoid certain foods and drinks as told  by your health care provider.  Take over-the-counter and prescription medicines only as told by your health care provider. Do not take aspirin, ibuprofen, or other NSAIDs unless your health care provider told you to do so.  Contact a health care provider if your symptoms do not improve or they get worse. This information is not intended to replace advice given to you by your health care provider. Make sure you discuss any questions you have with your health care provider. Document Revised: 02/25/2018 Document Reviewed: 02/25/2018 Elsevier Patient Education  [place].

## 2020-03-25 NOTE — Assessment & Plan Note (Signed)
-   Stable, PHQ9 score of 2  - Continue Wellbutrin and Celexa. - Will continue to monitor.

## 2020-03-25 NOTE — Progress Notes (Signed)
Established Patient Office Visit  Subjective:  Patient ID: Mario Newman, male    DOB: [birth year]  Age: 59 y.o. MRN: [id number]  CC:  Chief Complaint  Patient presents with  . Hypertension    HPI Mario Newman presents for follow-up on hypertension and heartburn.  HTN: Pt denies chest pain, palpitations, dizziness or lower extremity swelling. Taking medication as directed without side effects. Doesn't check BP at home. Pt follows a low salt diet.  GERD: Reports famotidine has helped with heartburn when he has symptoms. But he continues to have flare-ups 2-3 times/wk. Denies noticing any certain foods that trigger his heartburn.   Mood- depression: Stable. Reports medication compliance without issues.   Nasal congestion: Reports he has nasal congestion mostly at night and would like some advice and what he can try.  History reviewed. No pertinent past medical history.  History reviewed. No pertinent surgical history.  Family History  Problem Relation Age of Onset  . Hypertension Mother   . Diabetes Father   . Alcohol abuse Father   . Healthy Sister     Social History   Socioeconomic History  . Marital status: Married    Spouse name: Not on file  . Number of children: Not on file  . Years of education: Not on file  . Highest education level: Not on file  Occupational History  . Not on file  Tobacco Use  . Smoking status: Current Every Day Smoker    Packs/day: 1.00    Years: 20.00    Pack years: 20.00    Types: Cigarettes  . Smokeless tobacco: Never Used  Vaping Use  . Vaping Use: Every day  Substance and Sexual Activity  . Alcohol use: No  . Drug use: No  . Sexual activity: Yes    Birth control/protection: None  Other Topics Concern  . Not on file  Social History Narrative  . Not on file   Social Determinants of Health   Financial Resource Strain:   . Difficulty of Paying Living Expenses:   Food Insecurity:   . Worried About Charity fundraiser  in the Last Year:   . Arboriculturist in the Last Year:   Transportation Needs:   . Film/video editor (Medical):   Marland Kitchen Lack of Transportation (Non-Medical):   Physical Activity:   . Days of Exercise per Week:   . Minutes of Exercise per Session:   Stress:   . Feeling of Stress :   Social Connections:   . Frequency of Communication with Friends and Family:   . Frequency of Social Gatherings with Friends and Family:   . Attends Religious Services:   . Active Member of Clubs or Organizations:   . Attends Archivist Meetings:   Marland Kitchen Marital Status:   Intimate Partner Violence:   . Fear of Current or Ex-Partner:   . Emotionally Abused:   Marland Kitchen Physically Abused:   . Sexually Abused:     Outpatient Medications Prior to Visit  Medication Sig Dispense Refill  . atorvastatin (LIPITOR) 40 MG tablet TAKE 1 TABLET DAILY AT 6 PM 90 tablet 0  . buPROPion (WELLBUTRIN SR) 150 MG 12 hr tablet First three days 1 tab daily, then increase to 1 tab twice daily- hold at this dose. 180 tablet 1  . citalopram (CELEXA) 40 MG tablet TAKE 1 TABLET (40 MG TOTAL) BY MOUTH DAILY. OFFICE VISIT REQUIRED PRIOR TO ANY FURTHER REFILLS 90 tablet 0  . lisinopril (ZESTRIL) 10  MG tablet TAKE 1 TABLET BY MOUTH EVERY DAY 90 tablet 0   No facility-administered medications prior to visit.    No Known Allergies  ROS Review of Systems Review of Systems: General: Denies fever, chills, unexplained weight loss.  Respiratory: Denies SOB, DOE Cardiovascular: Denies chest pain, palpitations, new onset peripheral edema  Gastrointestinal: Denies nausea, vomiting, diarrhea.  Genitourinary: Denies dysuria, freq/ urgency, flank pain  Endocrine: Denies hot or cold intolerance, polyuria, polydipsia. Musculoskeletal: Denies unexplained myalgias, joint swelling, unexplained arthralgias, gait problems.  Skin:  Denies rash, suspicious lesions Neurological:  Denies dizziness, unexplained weakness, numbness   Psychiatric/Behavioral: Denies mood changes, suicidal or homicidal ideations, hallucinations    Objective:    Physical Exam  BP 132/85   Pulse 94   Temp 97.9 F (36.6 C) (Oral)   Ht 5\' 8"  (1.727 m)   Wt 202 lb 6.4 oz (91.8 kg)   SpO2 93%   BMI 30.77 kg/m  Wt Readings from Last 3 Encounters:  03/25/20 202 lb 6.4 oz (91.8 kg)  01/19/20 216 lb 9.6 oz (98.2 kg)  10/28/19 208 lb (94.3 kg)     There are no preventive care reminders to display for this patient.  There are no preventive care reminders to display for this patient.  Lab Results  Component Value Date   TSH 0.700 01/19/2020   Lab Results  Component Value Date   WBC 7.3 01/19/2020   HGB 14.0 01/19/2020   HCT 43.1 01/19/2020   MCV 76 (L) 01/19/2020   PLT 279 01/19/2020   Lab Results  Component Value Date   NA 137 01/19/2020   K 4.3 01/19/2020   CO2 19 (L) 01/19/2020   GLUCOSE 95 01/19/2020   BUN 10 01/19/2020   CREATININE 1.06 01/19/2020   BILITOT 0.4 01/19/2020   ALKPHOS 84 01/19/2020   AST 28 01/19/2020   ALT 27 01/19/2020   PROT 7.1 01/19/2020   ALBUMIN 4.4 01/19/2020   CALCIUM 9.7 01/19/2020   Lab Results  Component Value Date   CHOL 114 01/19/2020   Lab Results  Component Value Date   HDL 40 01/19/2020   Lab Results  Component Value Date   LDLCALC 57 01/19/2020   Lab Results  Component Value Date   TRIG 89 01/19/2020   Lab Results  Component Value Date   CHOLHDL 2.9 01/19/2020   Lab Results  Component Value Date   HGBA1C 6.3 (H) 01/19/2020      Assessment & Plan:   Problem List Items Addressed This Visit      Cardiovascular and Mediastinum   Essential hypertension - Primary    - BP today is 132/85 HR 94, stable - Continue Lisinopril 10 mg - Encourage ambulatory BP and pulse monitoring, especially when not feeling well. - Continue DASH diet. - Encourage to stay as active as possible.         Digestive   Gastroesophageal reflux disease with esophagitis    -  Stable - Advised patient to take famotidine daily for 2-3 weeks and then can take as needed to help reduce flare-ups.  - Will continue to monitor.         Other   Depression, recurrent ([place])    - Stable, PHQ9 score of 2  - Continue Wellbutrin and Celexa. - Will continue to monitor.       Other Visit Diagnoses    Nasal congestion         Nasal congestion: - Advised patient to use OTC flonase  spray, 1-2 sprays in each nostril before bedtime.   No orders of the defined types were placed in this encounter.   Follow-up: Return in about 4 months (around 07/25/2020) for HTN, Heartburn, Mood.    Lorrene Reid, PA-C

## 2020-05-08 ENCOUNTER — Other Ambulatory Visit: Payer: Self-pay | Admitting: Physician Assistant

## 2020-05-09 ENCOUNTER — Other Ambulatory Visit: Payer: Self-pay | Admitting: Physician Assistant

## 2020-05-11 ENCOUNTER — Other Ambulatory Visit: Payer: Self-pay | Admitting: Physician Assistant

## 2020-07-20 ENCOUNTER — Ambulatory Visit: Payer: Managed Care, Other (non HMO) | Admitting: Physician Assistant

## 2020-07-22 ENCOUNTER — Other Ambulatory Visit: Payer: Self-pay

## 2020-07-22 ENCOUNTER — Ambulatory Visit (INDEPENDENT_AMBULATORY_CARE_PROVIDER_SITE_OTHER): Payer: Managed Care, Other (non HMO) | Admitting: Physician Assistant

## 2020-07-22 ENCOUNTER — Encounter: Payer: Self-pay | Admitting: Physician Assistant

## 2020-07-22 VITALS — Ht 68.0 in | Wt 202.0 lb

## 2020-07-22 DIAGNOSIS — K2100 Gastro-esophageal reflux disease with esophagitis, without bleeding: Secondary | ICD-10-CM

## 2020-07-22 DIAGNOSIS — F339 Major depressive disorder, recurrent, unspecified: Secondary | ICD-10-CM | POA: Diagnosis not present

## 2020-07-22 DIAGNOSIS — I10 Essential (primary) hypertension: Secondary | ICD-10-CM | POA: Diagnosis not present

## 2020-07-22 MED ORDER — OMEPRAZOLE 20 MG PO CPDR: 20.0000 mg | DELAYED_RELEASE_CAPSULE | Freq: Every day | ORAL | 3 refills | Status: DC

## 2020-07-22 NOTE — Progress Notes (Signed)
Telehealth office visit note for Mario Reid, PA-C- at Primary Care at [facility]   I connected with current patient today by telephone and verified that I am speaking with the correct person   . Location of the patient: Home . Location of the provider: Office - This visit type was conducted due to national recommendations for restrictions regarding the COVID-19 Pandemic (e.g. social distancing) in an effort to limit this patient's exposure and mitigate transmission in our community.    - No physical exam could be performed with this format, beyond that communicated to [place] by the patient/ family members as noted.   - Additionally my office staff/ schedulers were to discuss with the patient that there may be a monetary charge related to this service, depending on their medical insurance.  My understanding is that patient understood and consented to proceed.     _________________________________________________________________________________   History of Present Illness: Patient calls in to follow up on hypertension and mood management. Has no acute concerns.  HTN: Pt denies chest pain, palpitations, dizziness or leg swelling. Taking medication as directed without side effects. Reports good hydration. Pt follows a low salt diet.  Mood: Reports medication compliance. Has no concerns. Denies SI/HI.  GERD: Continues to have heartburn issues despite taking famotidine. Has not tried anything else in the past. Symptom onset usually happens at bedtime.      No flowsheet data found.  Depression screen [facility] 2/9 07/22/2020 03/25/2020 01/19/2020 10/28/2019 07/28/2019  Decreased Interest 0 0 0 0 0  Down, Depressed, Hopeless 0 0 0 0 0  PHQ - 2 Score 0 0 0 0 0  Altered sleeping 0 1 0 0 0  Tired, decreased energy 1 1 1  0 1  Change in appetite 0 0 0 0 0  Feeling bad or failure about yourself  0 0 0 0 0  Trouble concentrating 0 0 0 0 0  Moving slowly or fidgety/restless 0 0 0 0 0  Suicidal  thoughts 0 0 0 0 0  PHQ-9 Score 1 2 1  0 1  Difficult doing work/chores Not difficult at all Not difficult at all Not difficult at all - Not difficult at all  Some recent data might be hidden      Impression and Recommendations:     1. Gastroesophageal reflux disease with esophagitis, unspecified whether hemorrhage   2. Essential hypertension   3. Depression, recurrent ([place])     Essential HTN: - Unable to obtain BP - Continue current medication regimen. - Stay well hydrated. - Continue low sodium diet. - Will continue to monitor.  Depression, recurrent: -Stable, PHQ-9 score of 1 -Continue current medication regimen. -Will continue to monitor.  GERD: -H2 blocker has provided minimal relief so patient will likely benefit from starting PPI therapy. Patient is agreeable to starting Omeprazole. -Recommend to reduce/avoid provocative foods and elevate the head of the bed. -Will continue to monitor and reassess medication therapy next OV.    - As part of my medical decision making, I reviewed the following data within the [place] History obtained from pt /family, CMA notes reviewed and incorporated if applicable, Labs reviewed, Radiograph/ tests reviewed if applicable and OV notes from prior OV's with me, as well as any other specialists she/he has seen since seeing me last, were all reviewed and used in my medical decision making process today.    - Additionally, when appropriate, discussion had with patient regarding our treatment plan, and their  biases/concerns about that plan were used in my medical decision making today.    - The patient agreed with the plan and demonstrated an understanding of the instructions.   No barriers to understanding were identified.     - The patient was advised to call back or seek an in-person evaluation if the symptoms worsen or if the condition fails to improve as anticipated.   Return in about 4 months (around 11/22/2020) for  HTN, Mood, GERD- started new med.    No orders of the defined types were placed in this encounter.   Meds ordered this encounter  Medications  . omeprazole (PRILOSEC) 20 MG capsule    Sig: Take 1 capsule (20 mg total) by mouth daily.    Dispense:  30 capsule    Refill:  3    Order Specific Question:   Supervising Provider    Answer:   Beatrice Lecher D [[id number]]    Medications Discontinued During This Encounter  Medication Reason  . buPROPion (WELLBUTRIN SR) 150 MG 12 hr tablet Completed Course       Time spent on visit including pre-visit chart review and post-visit care was 10 minutes.      The [place] was signed into law in 2016 which includes the topic of electronic health records.  This provides immediate access to information in MyChart.  This includes consultation notes, operative notes, office notes, lab results and pathology reports.  If you have any questions about what you read please let us know at your next visit or call us at the office.  We are right here with you.  Note:  This note was prepared with assistance of Dragon voice recognition software. Occasional wrong-word or sound-a-like substitutions may have occurred due to the inherent limitations of voice recognition software.  __________________________________________________________________________________     Patient Care Team    Relationship Specialty Notifications Start End  Mario Newman, [place] PCP - General Physician Assistant  03/25/20      -Vitals obtained; medications/ allergies reconciled;  personal medical, social, Sx etc.histories were updated by CMA, reviewed by me and are reflected in chart   Patient Active Problem List   Diagnosis Date Noted  . Gastroesophageal reflux disease with esophagitis 01/19/2020  . Right lower arm weakness/ pain at times 01/19/2020  . Cervical pain (neck) 07/28/2019  . Chest pain 02/18/2018  . Dyspnea 02/18/2018  . Nocturia 11/20/2017  .  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 Strain of back 11/20/2017  . Elevated LDL cholesterol level 11/20/2017  . Essential hypertension 09/04/2017  . Healthcare maintenance 09/04/2017  . Tobacco use 09/04/2017  . Depression, recurrent ([place]) 09/04/2017     Current Meds  Medication Sig  . atorvastatin (LIPITOR) 40 MG tablet TAKE 1 TABLET BY MOUTH EVERY DAY AT 6 PM  . citalopram (CELEXA) 40 MG tablet Take 1 tablet (40 mg total) by mouth daily.  . famotidine (PEPCID) 20 MG tablet Take 20 mg by mouth daily.  Marland Kitchen lisinopril (ZESTRIL) 10 MG tablet TAKE 1 TABLET BY MOUTH EVERY DAY     Allergies:  No Known Allergies   ROS:  See above HPI for pertinent positives and negatives   Objective:   Height 5\' 8"  (1.727 m), weight 202 lb (91.6 kg).  (if some vitals are omitted, this means that patient was UNABLE to obtain them even though they were asked to get them prior to OV today.  They were asked to call us at their earliest convenience  with these once obtained. ) General: A & O * 3; sounds in no acute distress; in usual state of health.  Respiratory: speaking in full sentences, no conversational dyspnea Psych: insight appears good, mood- appears full

## 2020-07-30 ENCOUNTER — Other Ambulatory Visit: Payer: Self-pay

## 2020-07-30 ENCOUNTER — Ambulatory Visit (INDEPENDENT_AMBULATORY_CARE_PROVIDER_SITE_OTHER): Payer: Managed Care, Other (non HMO)

## 2020-07-30 DIAGNOSIS — Z23 Encounter for immunization: Secondary | ICD-10-CM

## 2020-07-30 NOTE — Progress Notes (Signed)
Pt here for influenza vaccine.  Screening questionnaire reviewed, VIS provided to patient, and any/all patient questions answered.  T. Tarique Loveall, CMA  

## 2020-08-04 ENCOUNTER — Other Ambulatory Visit: Payer: Self-pay | Admitting: Physician Assistant

## 2020-08-06 ENCOUNTER — Other Ambulatory Visit: Payer: Self-pay | Admitting: Physician Assistant

## 2020-10-13 ENCOUNTER — Telehealth: Payer: Self-pay | Admitting: Physician Assistant

## 2020-10-13 NOTE — Telephone Encounter (Signed)
Referred to ED or Urgent Care for Covid symptoms

## 2020-10-22 ENCOUNTER — Other Ambulatory Visit: Payer: Self-pay | Admitting: Physician Assistant

## 2020-10-22 DIAGNOSIS — K2100 Gastro-esophageal reflux disease with esophagitis, without bleeding: Secondary | ICD-10-CM

## 2020-11-03 ENCOUNTER — Telehealth: Payer: Self-pay | Admitting: Physician Assistant

## 2020-11-03 NOTE — Telephone Encounter (Signed)
Patient's wife called in stating her husband is experiencing back pain. The schedule was full so we recommended he go to urgent care. Thanks

## 2020-11-06 ENCOUNTER — Other Ambulatory Visit: Payer: Self-pay | Admitting: Physician Assistant

## 2020-11-08 ENCOUNTER — Encounter: Payer: Self-pay | Admitting: Physician Assistant

## 2020-11-08 ENCOUNTER — Other Ambulatory Visit: Payer: Self-pay

## 2020-11-08 ENCOUNTER — Ambulatory Visit (INDEPENDENT_AMBULATORY_CARE_PROVIDER_SITE_OTHER): Payer: Managed Care, Other (non HMO) | Admitting: Physician Assistant

## 2020-11-08 VITALS — BP 141/86 | HR 76 | Temp 97.4°F | Ht 68.0 in | Wt 201.2 lb

## 2020-11-08 DIAGNOSIS — M545 Low back pain, unspecified: Secondary | ICD-10-CM

## 2020-11-08 MED ORDER — PREDNISONE 20 MG PO TABS: ORAL_TABLET | ORAL | 0 refills | Status: DC

## 2020-11-08 MED ORDER — BACLOFEN 10 MG PO TABS: 10.0000 mg | ORAL_TABLET | Freq: Two times a day (BID) | ORAL | 0 refills | Status: DC | PRN

## 2020-11-08 NOTE — Progress Notes (Signed)
Acute Office Visit  Subjective:    Patient ID: Mario Newman, male    DOB: [birth year], 60 y.o.   MRN: [id number]  Chief Complaint  Patient presents with  . Back Pain    HPI Patient is in today for right hip pain for for several weeks and side pain (thigh) when laying on that side at night.  Has tried heat therapy, hot bath, Tylenol and Motrin with minimal relief.  Denies injury or trauma, numbness or tingling sensation, fever, bowel or bladder dysfunction.  History reviewed. No pertinent past medical history.  History reviewed. No pertinent surgical history.  Family History  Problem Relation Age of Onset  . Hypertension Mother   . Diabetes Father   . Alcohol abuse Father   . Healthy Sister     Social History   Socioeconomic History  . Marital status: Married    Spouse name: Not on file  . Number of children: Not on file  . Years of education: Not on file  . Highest education level: Not on file  Occupational History  . Not on file  Tobacco Use  . Smoking status: Current Every Day Smoker    Packs/day: 1.00    Years: 20.00    Pack years: 20.00    Types: Cigarettes  . Smokeless tobacco: Never Used  Vaping Use  . Vaping Use: Every day  Substance and Sexual Activity  . Alcohol use: No  . Drug use: No  . Sexual activity: Yes    Birth control/protection: None  Other Topics Concern  . Not on file  Social History Narrative  . Not on file   Social Determinants of Health   Financial Resource Strain: Not on file  Food Insecurity: Not on file  Transportation Needs: Not on file  Physical Activity: Not on file  Stress: Not on file  Social Connections: Not on file  Intimate Partner Violence: Not on file    Outpatient Medications Prior to Visit  Medication Sig Dispense Refill  . atorvastatin (LIPITOR) 40 MG tablet TAKE 1 TABLET BY MOUTH EVERY DAY AT 6 PM 90 tablet 0  . citalopram (CELEXA) 40 MG tablet TAKE 1 TABLET BY MOUTH EVERY DAY 90 tablet 1  .  famotidine (PEPCID) 20 MG tablet Take 20 mg by mouth daily.    Marland Kitchen lisinopril (ZESTRIL) 10 MG tablet TAKE 1 TABLET BY MOUTH EVERY DAY 90 tablet 0  . omeprazole (PRILOSEC) 20 MG capsule TAKE 1 CAPSULE BY MOUTH EVERY DAY 90 capsule 0   No facility-administered medications prior to visit.    No Known Allergies  Review of Systems A fourteen system review of systems was performed and found to be positive as per HPI.  Objective:    Physical Exam General:  Well Developed, well nourished, appropriate for stated age.  Neuro:  Alert and oriented,  extra-ocular muscles intact  HEENT:  Normocephalic, atraumatic, neck supple Skin:  no gross rash, warm, pink. Respiratory: Not using accessory muscles, speaking in full sentences- unlabored. MSK: No bony abnormality or step off, TTP of lumbar paraspinal muscles, no TTP of greater trochanter b/l, negative straight leg raise b/l, good ROM,  Vascular:  Ext warm, no cyanosis apprec.; no gross edema Psych:  No HI/SI, judgement and insight good, Euthymic mood. Full Affect.   BP (!) 141/86   Pulse 76   Temp (!) 97.4 F (36.3 C)   Ht 5\' 8"  (1.727 m)   Wt 201 lb 3.2 oz (91.3 kg)  SpO2 97%   BMI 30.59 kg/m  Wt Readings from Last 3 Encounters:  11/08/20 201 lb 3.2 oz (91.3 kg)  07/22/20 202 lb (91.6 kg)  03/25/20 202 lb 6.4 oz (91.8 kg)    There are no preventive care reminders to display for this patient.  There are no preventive care reminders to display for this patient.   Lab Results  Component Value Date   TSH 0.700 01/19/2020   Lab Results  Component Value Date   WBC 7.3 01/19/2020   HGB 14.0 01/19/2020   HCT 43.1 01/19/2020   MCV 76 (L) 01/19/2020   PLT 279 01/19/2020   Lab Results  Component Value Date   NA 137 01/19/2020   K 4.3 01/19/2020   CO2 19 (L) 01/19/2020   GLUCOSE 95 01/19/2020   BUN 10 01/19/2020   CREATININE 1.06 01/19/2020   BILITOT 0.4 01/19/2020   ALKPHOS 84 01/19/2020   AST 28 01/19/2020   ALT 27  01/19/2020   PROT 7.1 01/19/2020   ALBUMIN 4.4 01/19/2020   CALCIUM 9.7 01/19/2020   Lab Results  Component Value Date   CHOL 114 01/19/2020   Lab Results  Component Value Date   HDL 40 01/19/2020   Lab Results  Component Value Date   LDLCALC 57 01/19/2020   Lab Results  Component Value Date   TRIG 89 01/19/2020   Lab Results  Component Value Date   CHOLHDL 2.9 01/19/2020   Lab Results  Component Value Date   HGBA1C 6.3 (H) 01/19/2020       Assessment & Plan:   Problem List Items Addressed This Visit   None   Visit Diagnoses    Acute bilateral low back pain without sciatica    -  Primary   Relevant Medications   predniSONE (DELTASONE) 20 MG tablet   baclofen (LIORESAL) 10 MG tablet     Acute bilateral low back pain without sciatica: -No concerning signs or symptoms suggestive of cauda equina. Likely musculoskeletal etiology. Patient has tried multiple treatment modalities with minimal improvement so will start corticosteroid therapy and muscle relaxer. -Recommend gentle back stretches and exercises. Continue with home supportive therapy. -If symptoms fail to improve or worsen recommend further evaluation with imaging studies.  Meds ordered this encounter  Medications  . predniSONE (DELTASONE) 20 MG tablet    Sig: Take 2 tablets by mouth x 2 days, then 1 tablet x 2 days, then 0.5 tablet x 2 days    Dispense:  7 tablet    Refill:  0    Order Specific Question:   Supervising Provider    Answer:   Beatrice Lecher D [[id number]]  . baclofen (LIORESAL) 10 MG tablet    Sig: Take 1 tablet (10 mg total) by mouth 2 (two) times daily as needed for muscle spasms.    Dispense:  30 each    Refill:  0    Order Specific Question:   Supervising Provider    Answer:   Beatrice Lecher D [[id number]]   Note:  This note was prepared with assistance of Dragon voice recognition software. Occasional wrong-word or sound-a-like substitutions may have occurred due to the inherent  limitations of voice recognition software.   Lorrene Reid, PA-C

## 2020-11-08 NOTE — Patient Instructions (Addendum)
Acute Back Pain, Adult Acute back pain is sudden and usually short-lived. It is often caused by an injury to the muscles and tissues in the back. The injury may result from:  A muscle or ligament getting overstretched or torn (strained). Ligaments are tissues that connect bones to each other. Lifting something improperly can cause a back strain.  Wear and tear (degeneration) of the spinal disks. Spinal disks are circular tissue that provide cushioning between the bones of the spine (vertebrae).  Twisting motions, such as while playing sports or doing yard work.  A hit to the back.  Arthritis. You may have a physical exam, lab tests, and imaging tests to find the cause of your pain. Acute back pain usually goes away with rest and home care. Follow these instructions at home: Managing pain, stiffness, and swelling  Treatment may include medicines for pain and inflammation that are taken by mouth or applied to the skin, prescription pain medicine, or muscle relaxants. Take over-the-counter and prescription medicines only as told by your health care provider.  Your health care provider may recommend applying ice during the first 24-48 hours after your pain starts. To do this: ? Put ice in a plastic bag. ? Place a towel between your skin and the bag. ? Leave the ice on for 20 minutes, 2-3 times a day.  If directed, apply heat to the affected area as often as told by your health care provider. Use the heat source that your health care provider recommends, such as a moist heat pack or a heating pad. ? Place a towel between your skin and the heat source. ? Leave the heat on for 20-30 minutes. ? Remove the heat if your skin turns bright red. This is especially important if you are unable to feel pain, heat, or cold. You have a greater risk of getting burned. Activity  Do not stay in bed. Staying in bed for more than 1-2 days can delay your recovery.  Sit up and stand up straight. Avoid leaning  forward when you sit or hunching over when you stand. ? If you work at a desk, sit close to it so you do not need to lean over. Keep your chin tucked in. Keep your neck drawn back, and keep your elbows bent at a 90-degree angle (right angle). ? Sit high and close to the steering wheel when you drive. Add lower back (lumbar) support to your car seat, if needed.  Take short walks on even surfaces as soon as you are able. Try to increase the length of time you walk each day.  Do not sit, drive, or stand in one place for more than 30 minutes at a time. Sitting or standing for long periods of time can put stress on your back.  Do not drive or use heavy machinery while taking prescription pain medicine.  Use proper lifting techniques. When you bend and lift, use positions that put less stress on your back: ? Bend your knees. ? Keep the load close to your body. ? Avoid twisting.  Exercise regularly as told by your health care provider. Exercising helps your back heal faster and helps prevent back injuries by keeping muscles strong and flexible.  Work with a physical therapist to make a safe exercise program, as recommended by your health care provider. Do any exercises as told by your physical therapist.   Lifestyle  Maintain a healthy weight. Extra weight puts stress on your back and makes it difficult to have   good posture.  Avoid activities or situations that make you feel anxious or stressed. Stress and anxiety increase muscle tension and can make back pain worse. Learn ways to manage anxiety and stress, such as through exercise. General instructions  Sleep on a firm mattress in a comfortable position. Try lying on your side with your knees slightly bent. If you lie on your back, put a pillow under your knees.  Follow your treatment plan as told by your health care provider. This may include: ? Cognitive or behavioral therapy. ? Acupuncture or massage therapy. ? Meditation or yoga. Contact  a health care provider if:  You have pain that is not relieved with rest or medicine.  You have increasing pain going down into your legs or buttocks.  Your pain does not improve after 2 weeks.  You have pain at night.  You lose weight without trying.  You have a fever or chills. Get help right away if:  You develop new bowel or bladder control problems.  You have unusual weakness or numbness in your arms or legs.  You develop nausea or vomiting.  You develop abdominal pain.  You feel faint. Summary  Acute back pain is sudden and usually short-lived.  Use proper lifting techniques. When you bend and lift, use positions that put less stress on your back.  Take over-the-counter and prescription medicines and apply heat or ice as directed by your health care provider. This information is not intended to replace advice given to you by your health care provider. Make sure you discuss any questions you have with your health care provider. Document Revised: 06/18/2020 Document Reviewed: 06/18/2020 Elsevier Patient Education  2021 [place].    Hip Bursitis  Hip bursitis is the inflammation of one or more bursae in the hip joint. Bursae are small fluid-filled sacs that absorb shock and prevent bones from rubbing against each other. Hip bursitis can cause mild to moderate pain, and symptoms often come and go over time. What are the causes? This condition results from increased friction between the hip bones and the tendons around the hip joint. This condition can happen if you:  Overuse your hip muscles.  Injure your hip.  Have weak buttocks muscles.  Have bone spurs.  Have an infection. In some cases, the cause may not be known. What increases the risk? You are more likely to develop this condition if:  You injured your hip previously or had hip surgery.  You have a medical condition, such as arthritis, gout, diabetes, or thyroid disease.  You have spine  problems.  You have one leg that is shorter than the other.  You participate in athletic activities that include repetitive motion, like running.  You participate in sports where there is a risk of injury or falling, such as football, martial arts, or skiing. What are the signs or symptoms? Symptoms may come and go, and they often include:  Pain in the hip or groin area. Pain may get worse with movement.  Tenderness and swelling of the hip. In rare cases, the bursa may become infected. If this happens, you may get a fever, as well as warmth and redness in the hip area. How is this diagnosed? This condition may be diagnosed based on:  Your symptoms.  Your medical history.  A physical exam.  Imaging tests, such as: ? X-rays to check your bones. ? MRI or ultrasound to check your tendons and muscles. ? Bone scan.  A biopsy to remove fluid  from your inflamed bursa for testing. How is this treated? This condition is treated by resting, icing, applying pressure (compression), and raising (elevating) the injured area. This is called RICE treatment. In some cases, RICE treatment may not be enough to make your symptoms go away. Treatment may also include:  Taking medicine to help with swelling and pain.  Using crutches, a cane, or a walker to decrease the strain on your hip.  Getting a shot of cortisone medicine to help reduce swelling.  Taking other medicines if the bursa is infected.  Draining fluid out of the bursa to help relieve swelling.  Having surgery to remove a damaged or infected bursa. This is rare. Long-term treatment may include:  Physical therapy exercises for strength and flexibility.  Lifestyle changes, such as weight loss, to reduce the strain on the hip. Follow these instructions at home: Managing pain, stiffness, and swelling  If directed, put ice on the painful area. ? Put ice in a plastic bag. ? Place a towel between your skin and the bag. ? Leave  the ice on for 20 minutes, 2-3 times a day.  Raise (elevate) your hip as much as you can without pain. To do this, put a pillow under your hips while you lie down.  If directed, apply heat to the affected area as often as told by your health care provider. Use the heat source that your health care provider recommends, such as a moist heat pack or a heating pad. ? Place a towel between your skin and the heat source. ? Leave the heat on for 20-30 minutes. ? Remove the heat if your skin turns bright red. This is especially important if you are unable to feel pain, heat, or cold. You may have a greater risk of getting burned.      Activity  Do not use your hip to support your body weight until your health care provider says that you can. Use crutches, a cane, or a walker as told by your health care provider.  If the affected leg is one that you use to drive, ask your health care provider if it is safe to drive.  Rest and protect your hip as much as possible until your pain and swelling get better.  Return to your normal activities as told by your health care provider. Ask your health care provider what activities are safe for you.  Do exercises as told by your health care provider. General instructions  Take over-the-counter and prescription medicines only as told by your health care provider.  Gently massage and stretch your injured area as often as is comfortable.  Wear compression wraps only as told by your health care provider.  If one of your legs is shorter than the other, get fitted for a shoe insert or orthotic.  Maintain a healthy weight. Follow instructions from your health care provider for weight control. These may include dietary restrictions.  Keep all follow-up visits as told by your health care provider. This is important. How is this prevented?  Exercise regularly, as told by your health care provider.  Wear supportive footwear that is appropriate for your  sport.  Warm up and stretch before being active. Cool down and stretch after being active.  Take breaks regularly from repetitive activity.  If an activity irritates your hip or causes pain, avoid the activity as much as possible.  Avoid sitting down for long periods at a time. Where to find more information  American Academy of  Orthopaedic Surgeons: orthoinfo.aaos.org Contact a health care provider if:  You have a fever.  You develop new symptoms.  You have trouble walking or doing everyday activities.  You have pain that gets worse or does not get better with medicine.  You develop red skin or a feeling of warmth in your hip area. Get help right away if:  You cannot move your hip.  You have severe pain.  You cannot control the muscles in your feet. Summary  Hip bursitis is the inflammation of one or more bursae in the hip joint. Bursae are small fluid-filled sacs that absorb shock and prevent bones from rubbing against each other.  Hip bursitis can cause hip or groin pain, and symptoms often come and go over time.  This condition is often treated by resting, icing, applying pressure (compression), and raising (elevating) the injured area. Other treatments may be needed. This information is not intended to replace advice given to you by your health care provider. Make sure you discuss any questions you have with your health care provider. Document Revised: 07/28/2019 Document Reviewed: 06/03/2018 Elsevier Patient Education  2021 [employer].

## 2020-11-15 ENCOUNTER — Other Ambulatory Visit: Payer: Self-pay | Admitting: Physician Assistant

## 2020-11-15 DIAGNOSIS — M545 Low back pain, unspecified: Secondary | ICD-10-CM

## 2021-01-13 ENCOUNTER — Ambulatory Visit (INDEPENDENT_AMBULATORY_CARE_PROVIDER_SITE_OTHER): Payer: Managed Care, Other (non HMO) | Admitting: Nurse Practitioner

## 2021-01-13 ENCOUNTER — Other Ambulatory Visit: Payer: Self-pay

## 2021-01-13 ENCOUNTER — Encounter: Payer: Self-pay | Admitting: Nurse Practitioner

## 2021-01-13 VITALS — BP 124/82 | HR 72 | Temp 98.7°F | Ht 68.0 in | Wt 203.3 lb

## 2021-01-13 DIAGNOSIS — M7071 Other bursitis of hip, right hip: Secondary | ICD-10-CM | POA: Diagnosis not present

## 2021-01-13 MED ORDER — MELOXICAM 7.5 MG PO TABS: 7.5000 mg | ORAL_TABLET | Freq: Two times a day (BID) | ORAL | 1 refills | Status: DC | PRN

## 2021-01-13 NOTE — Patient Instructions (Signed)
Hip Bursitis  Hip bursitis is swelling of one or more fluid-filled sacs (bursae) in your hip joint. This condition can cause pain, and your symptoms may come and go over time. What are the causes?  Repeated use of your hip muscles.  Injury to the hip.  Weak butt muscles.  Bone spurs.  Infection. In some cases, the cause may not be known. What increases the risk? You are more likely to develop this condition if:  You had a past hip injury or hip surgery.  You have a condition, such as arthritis, gout, diabetes, or thyroid disease.  You have spine problems.  You have one leg that is shorter than the other.  You run a lot or do long-distance running.  You play sports where there is a risk of injury or falling, such as football, martial arts, or skiing. What are the signs or symptoms? Symptoms may come and go, and they often include:  Pain in the hip or groin area. Pain may get worse when you move your hip.  Tenderness and swelling of the hip. In rare cases, the bursa may become infected. If this happens, you may get a fever, as well as have warmth and redness in the hip area. How is this treated? This condition is treated by:  Resting your hip.  Icing your hip.  Wrapping the hip area with an elastic bandage (compression wrap).  Keeping the hip raised. Other treatments may include medicine, draining fluid out of the bursa, or using crutches, a cane, or a walker. Surgery may be needed, but this is rare. Long-term treatment may include doing exercises to help your strength and flexibility. It may also include lifestyle changes like losing weight to lessen the strain on your hip. Follow these instructions at home: Managing pain, stiffness, and swelling  If told, put ice on the painful area. ? Put ice in a plastic bag. ? Place a towel between your skin and the bag. ? Leave the ice on for 20 minutes, 2-3 times a day.  Raise your hip by putting a pillow under your hips  while you lie down. Stop if you feel pain.  If told, put heat on the affected area. Do this as often as told by your doctor. Use a moist heat pack or a heating pad as told by your doctor. ? Place a towel between your skin and the heat source. ? Leave the heat on for 20-30 minutes. ? Take off the heat if your skin turns bright red. This is very important if you are unable to feel pain, heat, or cold. You may have a greater risk of getting burned.      Activity  Do not use your hip to support your body weight until your doctor says that you can.  Use crutches, a cane, or a walker as told by your doctor.  If the affected leg is one that you use to drive, ask your doctor if it is safe to drive.  Rest and protect your hip as much as you can until you feel better.  Return to your normal activities as told by your doctor. Ask your doctor what activities are safe for you.  Do exercises as told by your doctor. General instructions  Take over-the-counter and prescription medicines only as told by your doctor.  Gently rub and stretch your injured area as often as is comfortable.  Wear elastic bandages only as told by your doctor.  If one of your legs is  shorter than the other, get fitted for a shoe insert or orthotic.  Keep a healthy weight. Follow instructions from your doctor.  Keep all follow-up visits as told by your doctor. This is important. How is this prevented?  Exercise regularly, as told by your doctor.  Wear the right shoes for the sport you play.  Warm up and stretch before being active. Cool down and stretch after being active.  Take breaks often from repeated activity.  Avoid activities that bother your hip or cause pain.  Avoid sitting down for a long time. Where to find more information  American Academy of Orthopaedic Surgeons: orthoinfo.aaos.org Contact a doctor if:  You have a fever.  You have new symptoms.  You have trouble walking or doing everyday  activities.  You have pain that gets worse or does not get better with medicine.  Your skin around your hip is red.  You get a feeling of warmth in your hip area. Get help right away if:  You cannot move your hip.  You have very bad pain.  You cannot control the muscles in your feet. Summary  Hip bursitis is swelling of one or more fluid-filled sacs (bursae) in your hip joint.  Symptoms often come and go over time.  This condition is often treated by resting and icing the hip. It also may help to keep the area raised and wrapped in an elastic bandage. Other treatments may be needed. This information is not intended to replace advice given to you by your health care provider. Make sure you discuss any questions you have with your health care provider. Document Revised: 07/28/2019 Document Reviewed: 06/03/2018 Elsevier Patient Education  2021 [place].  Hip Bursitis  Hip bursitis is swelling of one or more fluid-filled sacs (bursae) in your hip joint. This condition can cause pain, and your symptoms may come and go over time. What are the causes?  Repeated use of your hip muscles.  Injury to the hip.  Weak butt muscles.  Bone spurs.  Infection. In some cases, the cause may not be known. What increases the risk? You are more likely to develop this condition if:  You had a past hip injury or hip surgery.  You have a condition, such as arthritis, gout, diabetes, or thyroid disease.  You have spine problems.  You have one leg that is shorter than the other.  You run a lot or do long-distance running.  You play sports where there is a risk of injury or falling, such as football, martial arts, or skiing. What are the signs or symptoms? Symptoms may come and go, and they often include:  Pain in the hip or groin area. Pain may get worse when you move your hip.  Tenderness and swelling of the hip. In rare cases, the bursa may become infected. If this happens, you  may get a fever, as well as have warmth and redness in the hip area. How is this treated? This condition is treated by:  Resting your hip.  Icing your hip.  Wrapping the hip area with an elastic bandage (compression wrap).  Keeping the hip raised. Other treatments may include medicine, draining fluid out of the bursa, or using crutches, a cane, or a walker. Surgery may be needed, but this is rare. Long-term treatment may include doing exercises to help your strength and flexibility. It may also include lifestyle changes like losing weight to lessen the strain on your hip. Follow these instructions at home: Managing  pain, stiffness, and swelling  If told, put ice on the painful area. ? Put ice in a plastic bag. ? Place a towel between your skin and the bag. ? Leave the ice on for 20 minutes, 2-3 times a day.  Raise your hip by putting a pillow under your hips while you lie down. Stop if you feel pain.  If told, put heat on the affected area. Do this as often as told by your doctor. Use a moist heat pack or a heating pad as told by your doctor. ? Place a towel between your skin and the heat source. ? Leave the heat on for 20-30 minutes. ? Take off the heat if your skin turns bright red. This is very important if you are unable to feel pain, heat, or cold. You may have a greater risk of getting burned.      Activity  Do not use your hip to support your body weight until your doctor says that you can.  Use crutches, a cane, or a walker as told by your doctor.  If the affected leg is one that you use to drive, ask your doctor if it is safe to drive.  Rest and protect your hip as much as you can until you feel better.  Return to your normal activities as told by your doctor. Ask your doctor what activities are safe for you.  Do exercises as told by your doctor. General instructions  Take over-the-counter and prescription medicines only as told by your doctor.  Gently rub and  stretch your injured area as often as is comfortable.  Wear elastic bandages only as told by your doctor.  If one of your legs is shorter than the other, get fitted for a shoe insert or orthotic.  Keep a healthy weight. Follow instructions from your doctor.  Keep all follow-up visits as told by your doctor. This is important. How is this prevented?  Exercise regularly, as told by your doctor.  Wear the right shoes for the sport you play.  Warm up and stretch before being active. Cool down and stretch after being active.  Take breaks often from repeated activity.  Avoid activities that bother your hip or cause pain.  Avoid sitting down for a long time. Where to find more information  American Academy of [employer]opa[employer]geons: orthoinfo.aaos.org Contact a doctor if:  You have a fever.  You have new symptoms.  You have trouble walking or doing everyday activities.  You have pain that gets worse or does not get better with medicine.  Your skin around your hip is red.  You get a feeling of warmth in your hip area. Get help right away if:  You cannot move your hip.  You have very bad pain.  You cannot control the muscles in your feet. Summary  Hip bursitis is swelling of one or more fluid-filled sacs (bursae) in your hip joint.  Symptoms often come and go over time.  This condition is often treated by resting and icing the hip. It also may help to keep the area raised and wrapped in an elastic bandage. Other treatments may be needed. This information is not intended to replace advice given to you by your health care provider. Make sure you discuss any questions you have with your health care provider. Document Revised: 07/28/2019 Document Reviewed: 06/03/2018 Elsevier Patient Education  2021 [employer].

## 2021-01-13 NOTE — Progress Notes (Signed)
Acute Office Visit  Subjective:    Patient ID: Mario Newman, male    DOB: [birth year], 60 y.o.   MRN: [id number]  Chief Complaint  Patient presents with  . Hip Pain    HPI Patient is in today for evaluation of right hip pain. This is worse at night. Hurts more when he is laying on the right side. Has to move onto left side or his back, as right side is so uncomfortable. He was seen for similar symptoms about a month ago. Was given a short term prednisone taper. He states that pain did get better initially, but now is getting worse than it was prior to treatment. He describes pain a a dull ache which is present nearly all the time. He has been alternating doses of advil and tylenol which keeps pain manageable during the day. He denies known injury or trauma to the right hip. 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   No past medical history on file.  No past surgical history on file.  Family History  Problem Relation Age of Onset  . Hypertension Mother   . Diabetes Father   . Alcohol abuse Father   . Healthy Sister     Social History   Socioeconomic History  . Marital status: Married    Spouse name: Not on file  . Number of children: Not on file  . Years of education: Not on file  . Highest education level: Not on file  Occupational History  . Not on file  Tobacco Use  . Smoking status: Current Every Day Smoker    Packs/day: 1.00    Years: 20.00    Pack years: 20.00    Types: Cigarettes  . Smokeless tobacco: Never Used  Vaping Use  . Vaping Use: Every day  Substance and Sexual Activity  . Alcohol use: No  . Drug use: No  . Sexual activity: Yes    Birth control/protection: None  Other Topics Concern  . Not on file  Social History Narrative  . Not on file   Social Determinants of Health   Financial Resource Strain: Not on file  Food Insecurity: Not on file   Transportation Needs: Not on file  Physical Activity: Not on file  Stress: Not on file  Social Connections: Not on file  Intimate Partner Violence: Not on file    Outpatient Medications Prior to Visit  Medication Sig Dispense Refill  . atorvastatin (LIPITOR) 40 MG tablet TAKE 1 TABLET BY MOUTH EVERY DAY AT 6 PM 90 tablet 0  . baclofen (LIORESAL) 10 MG tablet Take 1 tablet (10 mg total) by mouth 2 (two) times daily as needed for muscle spasms. 30 each 0  . citalopram (CELEXA) 40 MG tablet TAKE 1 TABLET BY MOUTH EVERY DAY 90 tablet 1  . famotidine (PEPCID) 20 MG tablet Take 20 mg by mouth daily.    Marland Kitchen lisinopril (ZESTRIL) 10 MG tablet TAKE 1 TABLET BY MOUTH EVERY DAY 90 tablet 0  . omeprazole (PRILOSEC) 20 MG capsule TAKE 1 CAPSULE BY MOUTH EVERY DAY 90 capsule 0  . predniSONE (DELTASONE) 20 MG tablet Take 2 tablets by mouth x 2 days, then 1 tablet x 2 days, then 0.5 tablet x 2 days 7 tablet 0   No facility-administered medications prior to visit.    No Known Allergies  Review of Systems  Constitutional: Positive for activity change. Negative for chills and fever.       Physical activity is limited de to right hip tenderness.    HENT: Negative for congestion, postnasal drip, rhinorrhea, sinus pressure and sinus pain.   Eyes: Negative.   Respiratory: Negative for cough and wheezing.   Cardiovascular: Negative for chest pain and palpitations.  Gastrointestinal: Negative.  Negative for constipation.  Musculoskeletal: Positive for arthralgias. Negative for myalgias.       Right hip tenderness. Hurts more when sitting or laying on the right side. Also hurts when standing still for long periods of time.   Skin: Negative for rash.  Neurological: Negative for dizziness, weakness and headaches.  Hematological: Negative.   Psychiatric/Behavioral: The patient is not nervous/anxious.   All other systems reviewed and are negative.      Objective:    Physical Exam Vitals and nursing note  reviewed.  Constitutional:      Appearance: Normal appearance. He is well-developed.  HENT:     Head: Normocephalic and atraumatic.  Eyes:     Extraocular Movements: Extraocular movements intact.     Conjunctiva/sclera: Conjunctivae normal.     Pupils: Pupils are equal, round, and reactive to light.  Cardiovascular:     Rate and Rhythm: Normal rate and regular rhythm.     Heart sounds: Normal heart sounds.  Pulmonary:     Effort: Pulmonary effort is normal.     Breath sounds: Normal breath sounds.  Abdominal:     Palpations: Abdomen is soft.  Musculoskeletal:        General: Normal range of motion.     Cervical back: Normal range of motion and neck supple.     Comments: Tenderness with palpation of the right hip. No bony abnormalities or deformities are noted at this time. ROM is slightly limited due to pain.   Skin:    General: Skin is warm and dry.  Neurological:     General: No focal deficit present.     Mental Status: He is alert and oriented to person, place, and time.  Psychiatric:        Mood and Affect: Mood normal.        Behavior: Behavior normal.        Thought Content: Thought content normal.        Judgment: Judgment normal.     Today's Vitals   01/13/21 1524 01/13/21 1545  BP: (!) 142/87 124/82  Pulse: 75 72  Temp: 98.7 F (37.1 C)   SpO2: 99%   Weight: 203 lb 4.8 oz (92.2 kg)   Height: 5\' 8"  (1.727 m)    Body mass index is 30.91 kg/m.   Wt Readings from Last 3 Encounters:  01/13/21 203 lb 4.8 oz (92.2 kg)  11/08/20 201 lb 3.2 oz (91.3 kg)  07/22/20 202 lb (91.6 kg)    There are no preventive care reminders to display for this patient.  There are no preventive care reminders to display for this patient.   Lab Results  Component Value Date   TSH 0.700 01/19/2020   Lab Results  Component Value Date   WBC 7.3 01/19/2020   HGB 14.0 01/19/2020   HCT 43.1 01/19/2020   MCV 76 (L) 01/19/2020   PLT 279 01/19/2020   Lab Results  Component  Value Date   NA 137 01/19/2020   K 4.3 01/19/2020   CO2 19 (L) 01/19/2020   GLUCOSE 95 01/19/2020   BUN 10 01/19/2020  CREATININE 1.06 01/19/2020   BILITOT 0.4 01/19/2020   ALKPHOS 84 01/19/2020   AST 28 01/19/2020   ALT 27 01/19/2020   PROT 7.1 01/19/2020   ALBUMIN 4.4 01/19/2020   CALCIUM 9.7 01/19/2020   Lab Results  Component Value Date   CHOL 114 01/19/2020   Lab Results  Component Value Date   HDL 40 01/19/2020   Lab Results  Component Value Date   LDLCALC 57 01/19/2020   Lab Results  Component Value Date   TRIG 89 01/19/2020   Lab Results  Component Value Date   CHOLHDL 2.9 01/19/2020   Lab Results  Component Value Date   HGBA1C 6.3 (H) 01/19/2020       Assessment & Plan:  1. Bursitis of right hip, unspecified bursa Suspect bursitis of right hip. Start meloxicam 7.5mg  up to twice daily if needed for pain/inflammation. Refer to orthopedics for further evaluation and treatment.  - meloxicam (MOBIC) 7.5 MG tablet; Take 1 tablet (7.5 mg total) by mouth 2 (two) times daily as needed for pain.  Dispense: 45 tablet; Refill: 1 - Ambulatory referral to Orthopedic Surgery  Problem List Items Addressed This Visit      Musculoskeletal and Integument   Bursitis of right hip - Primary   Relevant Medications   meloxicam (MOBIC) 7.5 MG tablet   Other Relevant Orders   Ambulatory referral to Orthopedic Surgery       Meds ordered this encounter  Medications  . meloxicam (MOBIC) 7.5 MG tablet    Sig: Take 1 tablet (7.5 mg total) by mouth 2 (two) times daily as needed for pain.    Dispense:  45 tablet    Refill:  1    Order Specific Question:   Supervising Provider    Answer:   Beatrice Lecher D [[id number]]     Ronnell Freshwater, NP

## 2021-01-20 ENCOUNTER — Encounter: Payer: Self-pay | Admitting: Nurse Practitioner

## 2021-01-23 DIAGNOSIS — M7071 Other bursitis of hip, right hip: Secondary | ICD-10-CM | POA: Insufficient documentation

## 2021-01-26 ENCOUNTER — Other Ambulatory Visit: Payer: Self-pay | Admitting: Physician Assistant

## 2021-01-26 DIAGNOSIS — K2100 Gastro-esophageal reflux disease with esophagitis, without bleeding: Secondary | ICD-10-CM

## 2021-01-31 ENCOUNTER — Other Ambulatory Visit: Payer: Self-pay | Admitting: Physician Assistant

## 2021-02-02 ENCOUNTER — Ambulatory Visit: Payer: Managed Care, Other (non HMO) | Admitting: Orthopaedic Surgery

## 2021-02-02 ENCOUNTER — Ambulatory Visit (INDEPENDENT_AMBULATORY_CARE_PROVIDER_SITE_OTHER): Payer: Managed Care, Other (non HMO)

## 2021-02-02 ENCOUNTER — Encounter: Payer: Self-pay | Admitting: Orthopaedic Surgery

## 2021-02-02 DIAGNOSIS — M7061 Trochanteric bursitis, right hip: Secondary | ICD-10-CM | POA: Diagnosis not present

## 2021-02-02 DIAGNOSIS — M25551 Pain in right hip: Secondary | ICD-10-CM | POA: Diagnosis not present

## 2021-02-02 NOTE — Progress Notes (Signed)
Office Visit Note   Patient: Mario Newman           Date of Birth: [birth year]           MRN: [id number] Visit Date: 02/02/2021              Requested by: Lorrene Reid, PA-C [address] [place],  [place] [address] PCP: Lorrene Reid, PA-C   Assessment & Plan: Visit Diagnoses:  1. Pain in right hip   2. Trochanteric bursitis, right hip     Plan: I talked in length to the patient about trochanteric bursitis.  I showed him stretching exercises to try and we will have him not sleep on that side.  Have also recommended a topical Voltaren gel.  I did offer him a steroid injection and he deferred this appropriately.  I told him to give it 4 weeks.  If things or not improving we could always set him up for outpatient physical therapy and consider an injection.  All questions and concerns were answered addressed.  Follow-up is as needed.  Follow-Up Instructions: Return if symptoms worsen or fail to improve.   Orders:  Orders Placed This Encounter  Procedures  . XR HIP UNILAT W OR W/O PELVIS 1V RIGHT   No orders of the defined types were placed in this encounter.     Procedures: No procedures performed   Clinical Data: No additional findings.   Subjective: Chief Complaint  Patient presents with  . Right Hip - Follow-up  The patient is a very pleasant 60 year old gentleman who comes in with right hip pain over the trochanteric area for about 5 months now.  He says he lays on his side at night and several minutes into his sleep he wakes up with hip pain just on the right side and he points to the trochanteric area.  There is no groin pain.  He denies any significant injury or specific injury.  He is never had surgery on this area.  He denies any back issues or numbness and tingling.  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  He is not diabetic.  HPI  Review of Systems There is currently listed no headache, chest pain, shortness of breath, fever,  chills, nausea, vomiting  Objective: Vital Signs: There were no vitals taken for this visit.  Physical Exam He is alert and orient x3 and in no acute distress Ortho Exam Examination of his right hip shows full range of motion of that hip with no pain in the groin at all.  When I have him lay in the supine position his leg lengths are equal.  I then had a turn into decubitus position.  He is tender over the tip of the trochanteric area of the right hip. Specialty Comments:  No specialty comments available.  Imaging: XR HIP UNILAT W OR W/O PELVIS 1V RIGHT  Result Date: 02/02/2021 An AP pelvis and lateral right hip shows no acute findings.  The hip joint space is well-maintained with no arthritic findings.  There are no cortical irregularities around either trochanteric area.    PMFS History: Patient Active Problem List   Diagnosis Date Noted  . Bursitis of right hip 01/23/2021  . Gastroesophageal reflux disease with esophagitis 01/19/2020  . Right lower arm weakness/ pain at times 01/19/2020  . Cervical pain (neck) 07/28/2019  . Chest pain 02/18/2018  . Dyspnea 02/18/2018  . Nocturia 11/20/2017  . Tinnitus of both ears  11/20/2017  . Strain of back 11/20/2017  . Elevated LDL cholesterol level 11/20/2017  . Essential hypertension 09/04/2017  . Healthcare maintenance 09/04/2017  . Tobacco use 09/04/2017  . Depression, recurrent ([place]) 09/04/2017   History reviewed. No pertinent past medical history.  Family History  Problem Relation Age of Onset  . Hypertension Mother   . Diabetes Father   . Alcohol abuse Father   . Healthy Sister     History reviewed. No pertinent surgical history. Social History   Occupational History  . Not on file  Tobacco Use  . Smoking status: Current Every Day Smoker    Packs/day: 1.00    Years: 20.00    Pack years: 20.00    Types: Cigarettes  . Smokeless tobacco: Never Used  Vaping Use  . Vaping Use: Every day  Substance and Sexual  Activity  . Alcohol use: No  . Drug use: No  . Sexual activity: Yes    Birth control/protection: None

## 2021-02-08 ENCOUNTER — Other Ambulatory Visit: Payer: Self-pay | Admitting: Physician Assistant

## 2021-03-02 ENCOUNTER — Ambulatory Visit: Payer: Managed Care, Other (non HMO) | Admitting: Orthopaedic Surgery

## 2021-03-30 ENCOUNTER — Ambulatory Visit: Payer: Managed Care, Other (non HMO) | Admitting: Orthopaedic Surgery

## 2021-04-21 ENCOUNTER — Encounter: Payer: Self-pay | Admitting: Physician Assistant

## 2021-04-21 ENCOUNTER — Ambulatory Visit (INDEPENDENT_AMBULATORY_CARE_PROVIDER_SITE_OTHER): Payer: Managed Care, Other (non HMO) | Admitting: Physician Assistant

## 2021-04-21 ENCOUNTER — Other Ambulatory Visit: Payer: Self-pay

## 2021-04-21 VITALS — BP 129/79 | HR 84 | Temp 98.8°F | Ht 68.0 in | Wt 206.2 lb

## 2021-04-21 DIAGNOSIS — L700 Acne vulgaris: Secondary | ICD-10-CM

## 2021-04-21 NOTE — Progress Notes (Signed)
Acute Office Visit  Subjective:    Patient ID: Mario Newman, male    DOB: 1960-11-08, 60 y.o.   MRN: [id number]  Chief Complaint  Patient presents with   Cyst    HPI Patient is in today for c/o spot on his back. Denies tenderness, drainage, fever or chills. Reports last week started having tightness in that area which was new. He has had this spot for a while and had not bothered him until recently.   History reviewed. No pertinent past medical history.  History reviewed. No pertinent surgical history.  Family History  Problem Relation Age of Onset   Hypertension Mother    Diabetes Father    Alcohol abuse Father    Healthy Sister     Social History   Socioeconomic History   Marital status: Married    Spouse name: Not on file   Number of children: Not on file   Years of education: Not on file   Highest education level: Not on file  Occupational History   Not on file  Tobacco Use   Smoking status: Every Day    Packs/day: 1.00    Years: 20.00    Pack years: 20.00    Types: Cigarettes   Smokeless tobacco: Never  Vaping Use   Vaping Use: Every day  Substance and Sexual Activity   Alcohol use: No   Drug use: No   Sexual activity: Yes    Birth control/protection: None  Other Topics Concern   Not on file  Social History Narrative   Not on file   Social Determinants of Health   Financial Resource Strain: Not on file  Food Insecurity: Not on file  Transportation Needs: Not on file  Physical Activity: Not on file  Stress: Not on file  Social Connections: Not on file  Intimate Partner Violence: Not on file    Outpatient Medications Prior to Visit  Medication Sig Dispense Refill   atorvastatin (LIPITOR) 40 MG tablet TAKE 1 TABLET BY MOUTH EVERY DAY AT 6 PM 90 tablet 0   citalopram (CELEXA) 40 MG tablet TAKE 1 TABLET BY MOUTH EVERY DAY 90 tablet 0   lisinopril (ZESTRIL) 10 MG tablet TAKE 1 TABLET BY MOUTH EVERY DAY 90 tablet 0   omeprazole (PRILOSEC) 20  MG capsule TAKE 1 CAPSULE BY MOUTH EVERY DAY 90 capsule 0   baclofen (LIORESAL) 10 MG tablet Take 1 tablet (10 mg total) by mouth 2 (two) times daily as needed for muscle spasms. (Patient not taking: Reported on 04/21/2021) 30 each 0   famotidine (PEPCID) 20 MG tablet Take 20 mg by mouth daily. (Patient not taking: Reported on 04/21/2021)     meloxicam (MOBIC) 7.5 MG tablet Take 1 tablet (7.5 mg total) by mouth 2 (two) times daily as needed for pain. (Patient not taking: Reported on 04/21/2021) 45 tablet 1   predniSONE (DELTASONE) 20 MG tablet Take 2 tablets by mouth x 2 days, then 1 tablet x 2 days, then 0.5 tablet x 2 days (Patient not taking: Reported on 04/21/2021) 7 tablet 0   No facility-administered medications prior to visit.    No Known Allergies  Review of Systems A fourteen system review of systems was performed and found to be positive as per HPI.  Objective:    Physical Exam General:  Well Developed, well nourished, appropriate for stated age.  Neuro:  Alert and oriented,  extra-ocular muscles intact  HEENT:  Normocephalic, atraumatic, neck supple, no carotid bruits appreciated  Skin:  moderate sized open comedone near T-spine on the right side with small raised area with scab formation Cardiac:  RRR, S1 S2 Respiratory:  ECTA B/L, Not using accessory muscles, speaking in full sentences- unlabored. Vascular:  Ext warm, no cyanosis apprec.; cap RF less 2 sec. Psych:  No HI/SI, judgement and insight good, Euthymic mood. Full Affect.  BP 129/79   Pulse 84   Temp 98.8 F (37.1 C)   Ht 5\' 8"  (1.727 m)   Wt 206 lb 3.2 oz (93.5 kg)   SpO2 96%   BMI 31.35 kg/m  Wt Readings from Last 3 Encounters:  04/21/21 206 lb 3.2 oz (93.5 kg)  01/13/21 203 lb 4.8 oz (92.2 kg)  11/08/20 201 lb 3.2 oz (91.3 kg)    Health Maintenance Due  Topic Date Due   Pneumococcal Vaccine 52-45 Years old (1 - PCV) Never done    There are no preventive care reminders to display for this  patient.   Lab Results  Component Value Date   TSH 0.700 01/19/2020   Lab Results  Component Value Date   WBC 7.3 01/19/2020   HGB 14.0 01/19/2020   HCT 43.1 01/19/2020   MCV 76 (L) 01/19/2020   PLT 279 01/19/2020   Lab Results  Component Value Date   NA 137 01/19/2020   K 4.3 01/19/2020   CO2 19 (L) 01/19/2020   GLUCOSE 95 01/19/2020   BUN 10 01/19/2020   CREATININE 1.06 01/19/2020   BILITOT 0.4 01/19/2020   ALKPHOS 84 01/19/2020   AST 28 01/19/2020   ALT 27 01/19/2020   PROT 7.1 01/19/2020   ALBUMIN 4.4 01/19/2020   CALCIUM 9.7 01/19/2020   Lab Results  Component Value Date   CHOL 114 01/19/2020   Lab Results  Component Value Date   HDL 40 01/19/2020   Lab Results  Component Value Date   LDLCALC 57 01/19/2020   Lab Results  Component Value Date   TRIG 89 01/19/2020   Lab Results  Component Value Date   CHOLHDL 2.9 01/19/2020   Lab Results  Component Value Date   HGBA1C 6.3 (H) 01/19/2020       Assessment & Plan:   Problem List Items Addressed This Visit   None Visit Diagnoses     Giant comedone    -  Primary   Relevant Orders   Ambulatory referral to Dermatology      Giant comedone: -Open comedone is near thoracic spine so will defer removal to dermatology, patient is agreeable to referral. -No signs or symptoms present concerning for infection so will defer antibiotic therapy.  No orders of the defined types were placed in this encounter.  Note:  This note was prepared with assistance of Dragon voice recognition software. Occasional wrong-word or sound-a-like substitutions may have occurred due to the inherent limitations of voice recognition software.   Lorrene Reid, PA-C

## 2021-04-21 NOTE — Patient Instructions (Addendum)
Mediterranean Diet A Mediterranean diet refers to food and lifestyle choices that are based on the traditions of countries located on the [place] Sea. This way of eating has been shown to help prevent certain conditions and improve outcomes for people who have chronic diseases, like kidney disease and heart disease. What are tips for following this plan? Lifestyle  Cook and eat meals together with your family, when possible.  Drink enough fluid to keep your urine clear or pale yellow.  Be physically active every day. This includes: ? Aerobic exercise like running or swimming. ? Leisure activities like gardening, walking, or housework.  Get 7-8 hours of sleep each night.  If recommended by your health care provider, drink red wine in moderation. This means 1 glass a day for nonpregnant women and 2 glasses a day for men. A glass of wine equals 5 oz (150 mL). Reading food labels  Check the serving size of packaged foods. For foods such as rice and pasta, the serving size refers to the amount of cooked product, not dry.  Check the total fat in packaged foods. Avoid foods that have saturated fat or trans fats.  Check the ingredients list for added sugars, such as corn syrup.   Shopping  At the grocery store, buy most of your food from the areas near the walls of the store. This includes: ? Fresh fruits and vegetables (produce). ? Grains, beans, nuts, and seeds. Some of these may be available in unpackaged forms or large amounts (in bulk). ? Fresh seafood. ? Poultry and eggs. ? Low-fat dairy products.  Buy whole ingredients instead of prepackaged foods.  Buy fresh fruits and vegetables in-season from local farmers markets.  Buy frozen fruits and vegetables in resealable bags.  If you do not have access to quality fresh seafood, buy precooked frozen shrimp or canned fish, such as tuna, salmon, or sardines.  Buy small amounts of raw or cooked vegetables, salads, or olives from  the deli or salad bar at your store.  Stock your pantry so you always have certain foods on hand, such as olive oil, canned tuna, canned tomatoes, rice, pasta, and beans. Cooking  Cook foods with extra-virgin olive oil instead of using butter or other vegetable oils.  Have meat as a side dish, and have vegetables or grains as your main dish. This means having meat in small portions or adding small amounts of meat to foods like pasta or stew.  Use beans or vegetables instead of meat in common dishes like chili or lasagna.  Experiment with different cooking methods. Try roasting or broiling vegetables instead of steaming or sauteing them.  Add frozen vegetables to soups, stews, pasta, or rice.  Add nuts or seeds for added healthy fat at each meal. You can add these to yogurt, salads, or vegetable dishes.  Marinate fish or vegetables using olive oil, lemon juice, garlic, and fresh herbs. Meal planning  Plan to eat 1 vegetarian meal one day each week. Try to work up to 2 vegetarian meals, if possible.  Eat seafood 2 or more times a week.  Have healthy snacks readily available, such as: ? Vegetable sticks with hummus. ? Greek yogurt. ? Fruit and nut trail mix.  Eat balanced meals throughout the week. This includes: ? Fruit: 2-3 servings a day ? Vegetables: 4-5 servings a day ? Low-fat dairy: 2 servings a day ? Fish, poultry, or lean meat: 1 serving a day ? Beans and legumes: 2 or more servings a week ?   Nuts and seeds: 1-2 servings a day ? Whole grains: 6-8 servings a day ? Extra-virgin olive oil: 3-4 servings a day  Limit red meat and sweets to only a few servings a month   What are my food choices?  Mediterranean diet ? Recommended  Grains: Whole-grain pasta. Brown rice. Bulgar wheat. Polenta. Couscous. Whole-wheat bread. Oatmeal. Quinoa.  Vegetables: Artichokes. Beets. Broccoli. Cabbage. Carrots. Eggplant. Green beans. Chard. Kale. Spinach. Onions. Leeks. Peas. Squash.  Tomatoes. Peppers. Radishes.  Fruits: Apples. Apricots. Avocado. Berries. Bananas. Cherries. Dates. Figs. Grapes. Lemons. Melon. Oranges. Peaches. Plums. Pomegranate.  Meats and other protein foods: Beans. Almonds. Sunflower seeds. Pine nuts. Peanuts. Cod. Salmon. Scallops. Shrimp. Tuna. Tilapia. Clams. Oysters. Eggs.  Dairy: Low-fat milk. Cheese. Greek yogurt.  Beverages: Water. Red wine. Herbal tea.  Fats and oils: Extra virgin olive oil. Avocado oil. Grape seed oil.  Sweets and desserts: Greek yogurt with honey. Baked apples. Poached pears. Trail mix.  Seasoning and other foods: Basil. Cilantro. Coriander. Cumin. Mint. Parsley. Sage. Rosemary. Tarragon. Garlic. Oregano. Thyme. Pepper. Balsalmic vinegar. Tahini. Hummus. Tomato sauce. Olives. Mushrooms. ? Limit these  Grains: Prepackaged pasta or rice dishes. Prepackaged cereal with added sugar.  Vegetables: Deep fried potatoes (french fries).  Fruits: Fruit canned in syrup.  Meats and other protein foods: Beef. Pork. Lamb. Poultry with skin. Hot dogs. Bacon.  Dairy: Ice cream. Sour cream. Whole milk.  Beverages: Juice. Sugar-sweetened soft drinks. Beer. Liquor and spirits.  Fats and oils: Butter. Canola oil. Vegetable oil. Beef fat (tallow). Lard.  Sweets and desserts: Cookies. Cakes. Pies. Candy.  Seasoning and other foods: Mayonnaise. Premade sauces and marinades. The items listed may not be a complete list. Talk with your dietitian about what dietary choices are right for you. Summary  The Mediterranean diet includes both food and lifestyle choices.  Eat a variety of fresh fruits and vegetables, beans, nuts, seeds, and whole grains.  Limit the amount of red meat and sweets that you eat.  Talk with your health care provider about whether it is safe for you to drink red wine in moderation. This means 1 glass a day for nonpregnant women and 2 glasses a day for men. A glass of wine equals 5 oz (150 mL). This information  is not intended to replace advice given to you by your health care provider. Make sure you discuss any questions you have with your health care provider. Document Revised: 05/25/2016 Document Reviewed: 05/18/2016 Elsevier Patient Education  2020 [employer]  

## 2021-04-24 ENCOUNTER — Other Ambulatory Visit: Payer: Self-pay | Admitting: Physician Assistant

## 2021-04-24 DIAGNOSIS — K2100 Gastro-esophageal reflux disease with esophagitis, without bleeding: Secondary | ICD-10-CM

## 2021-04-30 ENCOUNTER — Other Ambulatory Visit: Payer: Self-pay | Admitting: Physician Assistant

## 2021-05-07 ENCOUNTER — Other Ambulatory Visit: Payer: Self-pay | Admitting: Physician Assistant

## 2021-07-23 ENCOUNTER — Other Ambulatory Visit: Payer: Self-pay | Admitting: Physician Assistant

## 2021-07-23 DIAGNOSIS — K2100 Gastro-esophageal reflux disease with esophagitis, without bleeding: Secondary | ICD-10-CM

## 2021-07-25 ENCOUNTER — Other Ambulatory Visit: Payer: Self-pay

## 2021-07-25 ENCOUNTER — Ambulatory Visit (INDEPENDENT_AMBULATORY_CARE_PROVIDER_SITE_OTHER): Payer: Managed Care, Other (non HMO) | Admitting: Physician Assistant

## 2021-07-25 ENCOUNTER — Encounter: Payer: Self-pay | Admitting: Physician Assistant

## 2021-07-25 VITALS — BP 123/86 | HR 80 | Temp 98.3°F | Ht 68.0 in | Wt 199.2 lb

## 2021-07-25 DIAGNOSIS — Z1329 Encounter for screening for other suspected endocrine disorder: Secondary | ICD-10-CM | POA: Diagnosis not present

## 2021-07-25 DIAGNOSIS — Z0000 Encounter for general adult medical examination without abnormal findings: Secondary | ICD-10-CM

## 2021-07-25 DIAGNOSIS — Z1321 Encounter for screening for nutritional disorder: Secondary | ICD-10-CM

## 2021-07-25 DIAGNOSIS — N529 Male erectile dysfunction, unspecified: Secondary | ICD-10-CM

## 2021-07-25 DIAGNOSIS — Z13228 Encounter for screening for other metabolic disorders: Secondary | ICD-10-CM

## 2021-07-25 DIAGNOSIS — F17210 Nicotine dependence, cigarettes, uncomplicated: Secondary | ICD-10-CM | POA: Diagnosis not present

## 2021-07-25 DIAGNOSIS — Z130 Encounter for screening for diseases of the blood and blood-forming organs and certain disorders involving the immune mechanism: Secondary | ICD-10-CM

## 2021-07-25 DIAGNOSIS — Z23 Encounter for immunization: Secondary | ICD-10-CM

## 2021-07-25 DIAGNOSIS — Z716 Tobacco abuse counseling: Secondary | ICD-10-CM

## 2021-07-25 MED ORDER — TADALAFIL 5 MG PO TABS: 5.0000 mg | ORAL_TABLET | Freq: Every day | ORAL | 0 refills | Status: DC | PRN

## 2021-07-25 NOTE — Progress Notes (Signed)
Male physical   Impression and Recommendations:    1. Health care maintenance   2. Need for influenza vaccination   3. Screening for endocrine, nutritional, metabolic and immunity disorder   4. Smoking greater than 20 pack years   5. Erectile dysfunction, unspecified erectile dysfunction type   6. Encounter for tobacco use cessation counseling      1) Anticipatory Guidance: Skin CA prevention- recommend to use sunscreen when outside along with skin surveillance; eat a balanced and modest diet; physical activity at least 25 minutes per day or minimum of 150 min/ week moderate to intense activity.  2) Immunizations / Screenings / Labs:   All immunizations are up-to-date per recommendations or will be updated today if pt allows.    -Patient understands with dental and vision screens they will schedule independently.  -Will obtain CBC, CMP, HgA1c, Lipid panel, TSH when fasting, if not already done past 12 mo/ recently. -Agreeable to influenza vaccine. UTD colonoscopy, immunizations.   3) Weight: Recommend to continue to improve diet habits to improve overall feelings of well being and objective health data. Improve nutrient density of diet through increasing intake of fruits and vegetables and decreasing saturated fats, white flour products and refined sugars.   4) Healthcare Maintenance: -Continue weight loss efforts with dietary changes.  -Continue current medication regimen. -Reviewed prior records, will send rx for tadalafil. Recommend to notify the clinic if unable to tolerate medication. Discussed potential side effects. -Stay well hydrated. -Encourage to reduce tobacco use. The patient was counseled on the dangers of tobacco use, and was advised to quit.  Reviewed strategies to maximize success, including removing cigarettes and smoking materials from environment, stress management, and support of family/friends. -Follow up in 6 months for HTN, mood, HLD   Orders Placed  This Encounter  Procedures   CT CHEST LUNG CA SCREEN LOW DOSE W/O CM    Standing Status:   Future    Standing Expiration Date:   07/25/2022    Order Specific Question:   Reason for Exam (SYMPTOM  OR DIAGNOSIS REQUIRED)    Answer:   Screening for lung cancer    Order Specific Question:   Preferred Imaging Location?    Answer:   [id number]   Flu Vaccine QUAD 3mo+IM (Fluarix, Fluzone & Alfiuria Quad PF)   CBC with Differential/Platelet    Standing Status:   Future    Number of Occurrences:   1    Standing Expiration Date:   08/25/2021   Comprehensive metabolic panel    Standing Status:   Future    Number of Occurrences:   1    Standing Expiration Date:   08/25/2021    Order Specific Question:   Has the patient fasted?    Answer:   Yes   Hemoglobin A1c    Standing Status:   Future    Number of Occurrences:   1    Standing Expiration Date:   08/25/2021   Lipid panel    Standing Status:   Future    Number of Occurrences:   1    Standing Expiration Date:   08/25/2021    Order Specific Question:   Has the patient fasted?    Answer:   Yes   TSH    Standing Status:   Future    Number of Occurrences:   1    Standing Expiration Date:   08/25/2021     Meds ordered this encounter  Medications  tadalafil (CIALIS) 5 MG tablet    Sig: Take 1 tablet (5 mg total) by mouth daily as needed for erectile dysfunction.    Dispense:  30 tablet    Refill:  0    Order Specific Question:   Supervising Provider    Answer:   Beatrice Lecher D [[id number]]      Return in about 6 months (around 01/23/2022) for HTN, mood, HLD.    Gross side effects, risk and benefits, and alternatives of medications discussed with patient.  Patient is aware that all medications have potential side effects and we are unable to predict every side effect or drug-drug interaction that may occur.  Expresses verbal understanding and consents to current therapy plan and treatment regimen.  Please see AVS handed  out to patient at the end of our visit for further patient instructions/ counseling done pertaining to today's office visit.       Subjective:        CC: CPE   HPI: Mario Newman is a 60 y.o. male who presents to [place] [facility] today for a yearly health maintenance exam.     Health Maintenance Summary  - Reviewed and updated, unless pt declines services.  Tobacco History Reviewed:   yes, continues to smoke 2/3 pack per day Abdominal Ultrasound:  n/a CT scan for screening lung CA:   will place order, current smoker with 20 pack yr hx  Alcohol / drug use:  No concerns, no excessive use / no use Eye exams: yes Dermatology home: yes  Male history: STD concerns:   none, monogamous Birth control method:   n/a Additional penile/ urinary concerns:   ED, states years ago tried Viagra and Cialis and wants to trial Cialis.     Immunization History  Administered Date(s) Administered   Influenza,inj,Quad PF,6+ Mos 10/08/2017, 12/16/2018, 07/28/2019, 07/30/2020, 07/25/2021   Janssen (J&J) SARS-COV-2 Vaccination 12/20/2019   Moderna Sars-Covid-2 Vaccination 08/14/2020, 02/11/2021   Tdap 12/16/2018   Zoster Recombinat (Shingrix) 07/28/2019, 01/19/2020     Health Maintenance  Topic Date Due   COLONOSCOPY (Pts 45-35yrs Insurance coverage will need to be confirmed)  02/06/2025   TETANUS/TDAP  12/15/2028   INFLUENZA VACCINE  Completed   COVID-19 Vaccine  Completed   Hepatitis C Screening  Completed   HIV Screening  Completed   Zoster Vaccines- Shingrix  Completed   HPV VACCINES  Aged Out       Wt Readings from Last 3 Encounters:  07/25/21 199 lb 3.2 oz (90.4 kg)  04/21/21 206 lb 3.2 oz (93.5 kg)  01/13/21 203 lb 4.8 oz (92.2 kg)   BP Readings from Last 3 Encounters:  07/25/21 123/86  04/21/21 129/79  01/13/21 124/82   Pulse Readings from Last 3 Encounters:  07/25/21 80  04/21/21 84  01/13/21 72    Patient Active Problem List    Diagnosis Date Noted   Bursitis of right hip 01/23/2021   Gastroesophageal reflux disease with esophagitis 01/19/2020   Right lower arm weakness/ pain at times 01/19/2020   Cervical pain (neck) 07/28/2019   Chest pain 02/18/2018   Dyspnea 02/18/2018   Nocturia 11/20/2017   Tinnitus of both ears 11/20/2017   Strain of back 11/20/2017   Elevated LDL cholesterol level 11/20/2017   Essential hypertension 09/04/2017   Healthcare maintenance 09/04/2017   Tobacco use 09/04/2017   Depression, recurrent ([place]) 09/04/2017    History reviewed. No pertinent past medical history.  History reviewed. No pertinent surgical history.  Family History  Problem Relation Age of Onset   Hypertension Mother    Diabetes Father    Alcohol abuse Father    Healthy Sister     Social History   Substance and Sexual Activity  Drug Use No  ,  Social History   Substance and Sexual Activity  Alcohol Use No  ,  Social History   Tobacco Use  Smoking Status Every Day   Packs/day: 1.00   Years: 20.00   Pack years: 20.00   Types: Cigarettes  Smokeless Tobacco Never  ,  Social History   Substance and Sexual Activity  Sexual Activity Yes   Birth control/protection: None    Patient's Medications  New Prescriptions   TADALAFIL (CIALIS) 5 MG TABLET    Take 1 tablet (5 mg total) by mouth daily as needed for erectile dysfunction.  Previous Medications   ATORVASTATIN (LIPITOR) 40 MG TABLET    TAKE 1 TABLET BY MOUTH EVERY DAY AT 6 PM   CITALOPRAM (CELEXA) 40 MG TABLET    TAKE 1 TABLET BY MOUTH EVERY DAY   LISINOPRIL (ZESTRIL) 10 MG TABLET    TAKE 1 TABLET BY MOUTH EVERY DAY  Modified Medications   Modified Medication Previous Medication   OMEPRAZOLE (PRILOSEC) 20 MG CAPSULE omeprazole (PRILOSEC) 20 MG capsule      TAKE 1 CAPSULE BY MOUTH EVERY DAY    TAKE 1 CAPSULE BY MOUTH EVERY DAY  Discontinued Medications   No medications on file    Patient has no known allergies.  Review of  Systems: General:   Denies fever, chills, unexplained weight loss.  Optho/Auditory:   Denies visual changes, LOV, ear ringing  Respiratory:   Denies SOB, DOE more than baseline levels.   Cardiovascular:   Denies chest pain, palpitations, new onset peripheral edema  Gastrointestinal:   Denies nausea, vomiting, diarrhea.  Genitourinary: Denies dysuria, freq/ urgency, flank pain   Endocrine:     Denies hot or cold intolerance, polyuria, polydipsia. Musculoskeletal:   Denies unexplained myalgias, joint swelling, gait problems.  Skin:  Denies rash, suspicious lesions Neurological:     Denies dizziness, unexplained weakness, numbness  Psychiatric/Behavioral:   Denies mood changes, suicidal or homicidal ideations, hallucinations    Objective:     Blood pressure 123/86, pulse 80, temperature 98.3 F (36.8 C), height 5\' 8"  (1.727 m), weight 199 lb 3.2 oz (90.4 kg), SpO2 96 %. Body mass index is 30.29 kg/m. General Appearance:    Alert, cooperative, no distress, appears stated age  Head:    Normocephalic, without obvious abnormality, atraumatic  Eyes:    PERRL, conjunctiva/corneas clear, EOM's intact, fundi    benign, both eyes  Ears:    Normal TM's and external ear canals, both ears  Nose:   Nares normal, septum midline, mucosa normal, no drainage    or sinus tenderness  Throat:   Lips w/o lesion, mucosa moist, and tongue normal; teeth and gums normal  Neck:   Supple, symmetrical, trachea midline, no adenopathy;    thyroid:  no enlargement/tenderness/nodules; no JVD  Back:     Symmetric, no curvature, ROM normal, no CVA tenderness  Lungs:     Clear to auscultation bilaterally, respirations unlabored, no  Wh/ R/ R  Chest Wall:    No tenderness or gross deformity; normal excursion   Heart:    Regular rate and rhythm, S1 and S2 normal, no murmur, rub   or gallop  Abdomen:     Soft, non-tender, bowel sounds active  all four quadrants, No G/R/R, no masses, no organomegaly  Genitalia:    Deferred.  Rectal:   Deferred.  Extremities:   Extremities normal, atraumatic, no cyanosis or gross edema  Pulses:   2+ and symmetric all extremities  Skin:   Warm, dry, Skin color, texture, turgor normal, no obvious rashes or lesions, giant comedone (back)  M-Sk:   Ambulates * 4 w/o difficulty, no gross deformities, tone WNL  Neurologic:   CNII-XII grossly intact Psych:  No HI/SI, judgement and insight good, Euthymic mood. Full Affect.

## 2021-07-25 NOTE — Patient Instructions (Signed)
Preventive Care 59-59 Years Old, Male Preventive care refers to lifestyle choices and visits with your health care provider that can promote health and wellness. This includes: A yearly physical exam. This is also called an annual wellness visit. Regular dental and eye exams. Immunizations. Screening for certain conditions. Healthy lifestyle choices, such as: Eating a healthy diet. Getting regular exercise. Not using drugs or products that contain nicotine and tobacco. Limiting alcohol use. What can I expect for my preventive care visit? Physical exam Your health care provider will check your: Height and weight. These may be used to calculate your BMI (body mass index). BMI is a measurement that tells if you are at a healthy weight. Heart rate and blood pressure. Body temperature. Skin for abnormal spots. Counseling Your health care provider may ask you questions about your: Past medical problems. Family's medical history. Alcohol, tobacco, and drug use. Emotional well-being. Home life and relationship well-being. Sexual activity. Diet, exercise, and sleep habits. Work and work environment. Access to firearms. What immunizations do I need? Vaccines are usually given at various ages, according to a schedule. Your health care provider will recommend vaccines for you based on your age, medical history, and lifestyle or other factors, such as travel or where you work. What tests do I need? Blood tests Lipid and cholesterol levels. These may be checked every 5 years, or more often if you are over 50 years old. Hepatitis C test. Hepatitis B test. Screening Lung cancer screening. You may have this screening every year starting at age 55 if you have a 30-pack-year history of smoking and currently smoke or have quit within the past 15 years. Prostate cancer screening. Recommendations will vary depending on your family history and other risks. Genital exam to check for testicular cancer  or hernias. Colorectal cancer screening. All adults should have this screening starting at age 50 and continuing until age 75. Your health care provider may recommend screening at age 45 if you are at increased risk. You will have tests every 1-10 years, depending on your results and the type of screening test. Diabetes screening. This is done by checking your blood sugar (glucose) after you have not eaten for a while (fasting). You may have this done every 1-3 years. STD (sexually transmitted disease) testing, if you are at risk. Follow these instructions at home: Eating and drinking  Eat a diet that includes fresh fruits and vegetables, whole grains, lean protein, and low-fat dairy products. Take vitamin and mineral supplements as recommended by your health care provider. Do not drink alcohol if your health care provider tells you not to drink. If you drink alcohol: Limit how much you have to 0-2 drinks a day. Be aware of how much alcohol is in your drink. In the [place] one drink equals one 12 oz bottle of beer (355 mL), one 5 oz glass of wine (148 mL), or one 1 oz glass of hard liquor (44 mL). Lifestyle Take daily care of your teeth and gums. Brush your teeth every morning and night with fluoride toothpaste. Floss one time each day. Stay active. Exercise for at least 30 minutes 5 or more days each week. Do not use any products that contain nicotine or tobacco, such as cigarettes, e-cigarettes, and chewing tobacco. If you need help quitting, ask your health care provider. Do not use drugs. If you are sexually active, practice safe sex. Use a condom or other form of protection to prevent STIs (sexually transmitted infections). If told by your   health care provider, take low-dose aspirin daily starting at age 50. Find healthy ways to cope with stress, such as: Meditation, yoga, or listening to music. Journaling. Talking to a trusted person. Spending time with friends and  family. Safety Always wear your seat belt while driving or riding in a vehicle. Do not drive: If you have been drinking alcohol. Do not ride with someone who has been drinking. When you are tired or distracted. While texting. Wear a helmet and other protective equipment during sports activities. If you have firearms in your house, make sure you follow all gun safety procedures. What's next? Go to your health care provider once a year for an annual wellness visit. Ask your health care provider how often you should have your eyes and teeth checked. Stay up to date on all vaccines. This information is not intended to replace advice given to you by your health care provider. Make sure you discuss any questions you have with your health care provider. Document Revised: 12/03/2020 Document Reviewed: 09/19/2018 Elsevier Patient Education  2022 [employer]   

## 2021-07-26 LAB — CBC WITH DIFFERENTIAL/PLATELET
Basophils Absolute: 0.1 10*3/uL (ref 0.0–0.2)
Basos: 1 %
EOS (ABSOLUTE): 0.2 10*3/uL (ref 0.0–0.4)
Eos: 2 %
Hematocrit: 44.1 % (ref 37.5–51.0)
Hemoglobin: 14.4 g/dL (ref 13.0–17.7)
Immature Grans (Abs): 0 10*3/uL (ref 0.0–0.1)
Immature Granulocytes: 0 %
Lymphocytes Absolute: 2.8 10*3/uL (ref 0.7–3.1)
Lymphs: 42 %
MCH: 24.1 pg — ABNORMAL LOW (ref 26.6–33.0)
MCHC: 32.7 g/dL (ref 31.5–35.7)
MCV: 74 fL — ABNORMAL LOW (ref 79–97)
Monocytes Absolute: 0.7 10*3/uL (ref 0.1–0.9)
Monocytes: 10 %
Neutrophils Absolute: 3 10*3/uL (ref 1.4–7.0)
Neutrophils: 45 %
Platelets: 287 10*3/uL (ref 150–450)
RBC: 5.98 x10E6/uL — ABNORMAL HIGH (ref 4.14–5.80)
RDW: 15.8 % — ABNORMAL HIGH (ref 11.6–15.4)
WBC: 6.7 10*3/uL (ref 3.4–10.8)

## 2021-07-26 LAB — COMPREHENSIVE METABOLIC PANEL
ALT: 18 IU/L (ref 0–44)
AST: 21 IU/L (ref 0–40)
Albumin/Globulin Ratio: 2.1 (ref 1.2–2.2)
Albumin: 4.7 g/dL (ref 3.8–4.9)
Alkaline Phosphatase: 80 IU/L (ref 44–121)
BUN/Creatinine Ratio: 12 (ref 9–20)
BUN: 13 mg/dL (ref 6–24)
Bilirubin Total: 0.3 mg/dL (ref 0.0–1.2)
CO2: 22 mmol/L (ref 20–29)
Calcium: 9 mg/dL (ref 8.7–10.2)
Chloride: 102 mmol/L (ref 96–106)
Creatinine, Ser: 1.07 mg/dL (ref 0.76–1.27)
Globulin, Total: 2.2 g/dL (ref 1.5–4.5)
Glucose: 89 mg/dL (ref 70–99)
Potassium: 4.4 mmol/L (ref 3.5–5.2)
Sodium: 137 mmol/L (ref 134–144)
Total Protein: 6.9 g/dL (ref 6.0–8.5)
eGFR: 80 mL/min/{1.73_m2} (ref >59)

## 2021-07-26 LAB — LIPID PANEL
Chol/HDL Ratio: 3.3 ratio (ref 0.0–5.0)
Cholesterol, Total: 128 mg/dL (ref 100–199)
HDL: 39 mg/dL — ABNORMAL LOW (ref >39)
LDL Chol Calc (NIH): 76 mg/dL (ref 0–99)
Triglycerides: 61 mg/dL (ref 0–149)
VLDL Cholesterol Cal: 13 mg/dL (ref 5–40)

## 2021-07-26 LAB — HEMOGLOBIN A1C
Est. average glucose Bld gHb Est-mCnc: 134 mg/dL
Hgb A1c MFr Bld: 6.3 % — ABNORMAL HIGH (ref 4.8–5.6)

## 2021-07-26 LAB — TSH: TSH: 0.824 u[IU]/mL (ref 0.450–4.500)

## 2021-07-31 ENCOUNTER — Other Ambulatory Visit: Payer: Self-pay | Admitting: Physician Assistant

## 2021-08-05 ENCOUNTER — Other Ambulatory Visit: Payer: Self-pay | Admitting: Physician Assistant

## 2021-08-11 ENCOUNTER — Ambulatory Visit: Payer: Managed Care, Other (non HMO)

## 2021-08-12 ENCOUNTER — Ambulatory Visit
Admission: RE | Admit: 2021-08-12 | Discharge: 2021-08-12 | Disposition: A | Discharge status: Home / Self Care | Payer: Managed Care, Other (non HMO) | Source: Ambulatory Visit | Attending: Physician Assistant | Admitting: Physician Assistant

## 2021-08-12 DIAGNOSIS — F17210 Nicotine dependence, cigarettes, uncomplicated: Secondary | ICD-10-CM

## 2021-10-22 ENCOUNTER — Other Ambulatory Visit: Payer: Self-pay | Admitting: Physician Assistant

## 2021-10-22 DIAGNOSIS — K2100 Gastro-esophageal reflux disease with esophagitis, without bleeding: Secondary | ICD-10-CM

## 2021-10-27 ENCOUNTER — Ambulatory Visit: Payer: Managed Care, Other (non HMO) | Admitting: Dermatology

## 2021-10-27 ENCOUNTER — Other Ambulatory Visit: Payer: Self-pay

## 2021-10-27 DIAGNOSIS — L301 Dyshidrosis [pompholyx]: Secondary | ICD-10-CM | POA: Diagnosis not present

## 2021-10-27 DIAGNOSIS — L209 Atopic dermatitis, unspecified: Secondary | ICD-10-CM | POA: Diagnosis not present

## 2021-10-27 DIAGNOSIS — L720 Epidermal cyst: Secondary | ICD-10-CM | POA: Diagnosis not present

## 2021-10-27 MED ORDER — OPZELURA 1.5 % EX CREA: 1.0000 "application " | TOPICAL_CREAM | Freq: Every day | CUTANEOUS | 1 refills | Status: DC | PRN

## 2021-10-27 NOTE — Patient Instructions (Addendum)
Pre-Operative Instructions  You are scheduled for a surgical procedure at [facility]. We recommend you read the following instructions. If you have any questions or concerns, please call the office at [phone].  Shower and wash the entire body with soap and water the day of your surgery paying special attention to cleansing at and around the planned surgery site.  Avoid aspirin or aspirin containing products at least fourteen (14) days prior to your surgical procedure and for at least one week (7 Days) after your surgical procedure. If you take aspirin on a regular basis for heart disease or history of stroke or for any other reason, we may recommend you continue taking aspirin but please notify us if you take this on a regular basis. Aspirin can cause more bleeding to occur during surgery as well as prolonged bleeding and bruising after surgery.   Avoid other nonsteroidal pain medications at least one week prior to surgery and at least one week prior to your surgery. These include medications such as Ibuprofen (Motrin, Advil and Nuprin), Naprosyn, Voltaren, Relafen, etc. If medications are used for therapeutic reasons, please inform us as they can cause increased bleeding or prolonged bleeding during and bruising after surgical procedures.   Please advise [place] if you are taking any "blood thinner" medications such as Coumadin or Dipyridamole or Plavix or similar medications. These cause increased bleeding and prolonged bleeding during procedures and bruising after surgical procedures. We may have to consider discontinuing these medications briefly prior to and shortly after your surgery if safe to do so.   Please inform us of all medications you are currently taking. All medications that are taken regularly should be taken the day of surgery as you always do. Nevertheless, we need to be informed of what medications you are taking prior to surgery to know whether they will affect the  procedure or cause any complications.   Please inform us of any medication allergies. Also inform us of whether you have allergies to Latex or rubber products or whether you have had any adverse reaction to Lidocaine or Epinephrine.  Please inform us of any prosthetic or artificial body parts such as artificial heart valve, joint replacements, etc., or similar condition that might require preoperative antibiotics.   We recommend avoidance of alcohol at least two weeks prior to surgery and continued avoidance for at least two weeks after surgery.   We recommend discontinuation of tobacco smoking at least two weeks prior to surgery and continued abstinence for at least two weeks after surgery.  Do not plan strenuous exercise, strenuous work or strenuous lifting for approximately four weeks after your surgery.   We request if you are unable to make your scheduled surgical appointment, please call us at least a week in advance or as soon as you are aware of a problem so that we can cancel or reschedule the appointment.   You MAY TAKE TYLENOL (acetaminophen) for pain as it is not a blood thinner.   PLEASE PLAN TO BE IN TOWN FOR TWO WEEKS FOLLOWING SURGERY, THIS IS IMPORTANT SO YOU CAN BE CHECKED FOR DRESSING CHANGES, SUTURE REMOVAL AND TO MONITOR FOR POSSIBLE COMPLICATIONS.         If You Need Anything After Your Visit  If you have any questions or concerns for your doctor, please call our main line at [phone] and press option 4 to reach your doctor's medical assistant. If no one answers, please leave a voicemail as directed and we will  return your call as soon as possible. Messages left after 4 pm will be answered the following business day.   You may also send [place] a message via [place]. We typically respond to MyChart messages within 1-2 business days.  For prescription refills, please ask your pharmacy to contact our office. Our fax number is [phone].  If you have an urgent  issue when the clinic is closed that cannot wait until the next business day, you can page your doctor at the number below.    Please note that while we do our best to be available for urgent issues outside of office hours, we are not available 24/7.   If you have an urgent issue and are unable to reach [place], you may choose to seek medical care at your doctor's office, retail clinic, urgent care center, or emergency room.  If you have a medical emergency, please immediately call 911 or go to the emergency department.  Pager Numbers  - Dr. Nehemiah Massed: [phone]  - Dr. Laurence Ferrari: [phone]  - Dr. Nicole Kindred: [phone]  In the event of inclement weather, please call our main line at [phone] for an update on the status of any delays or closures.  Dermatology Medication Tips: Please keep the boxes that topical medications come in in order to help keep track of the instructions about where and how to use these. Pharmacies typically print the medication instructions only on the boxes and not directly on the medication tubes.   If your medication is too expensive, please contact our office at [phone] option 4 or send [place] a message through [place].   We are unable to tell what your co-pay for medications will be in advance as this is different depending on your insurance coverage. However, we may be able to find a substitute medication at lower cost or fill out paperwork to get insurance to cover a needed medication.   If a prior authorization is required to get your medication covered by your insurance company, please allow [place] 1-2 business days to complete this process.  Drug prices often vary depending on where the prescription is filled and some pharmacies may offer cheaper prices.  The website www.goodrx.com contains coupons for medications through different pharmacies. The prices here do not account for what the cost may be with help from insurance (it may be cheaper with your  insurance), but the website can give you the price if you did not use any insurance.  - You can print the associated coupon and take it with your prescription to the pharmacy.  - You may also stop by our office during regular business hours and pick up a GoodRx coupon card.  - If you need your prescription sent electronically to a different pharmacy, notify our office through [facility] or by phone at [phone] option 4.     Si Usted Necesita Algo Despus de Su Visita  Tambin puede enviarnos un mensaje a travs de Pharmacist, community. Por lo general respondemos a los mensajes de MyChart en el transcurso de 1 a 2 das hbiles.  Para renovar recetas, por favor pida a su farmacia que se ponga en contacto con nuestra oficina. Harland Dingwall de fax es [place] [phone].  Si tiene un asunto urgente cuando la clnica est cerrada y que no puede esperar hasta el siguiente da hbil, puede llamar/localizar a su doctor(a) al nmero que aparece a continuacin.   Por favor, tenga en cuenta que aunque hacemos todo lo posible para estar disponibles para asuntos  urgentes fuera del horario de [place], no estamos disponibles las 24 horas del da, los 7 das de la [place].   Si tiene un problema urgente y no puede comunicarse con nosotros, puede optar por buscar atencin mdica  en el consultorio de su doctor(a), en una clnica privada, en un centro de atencin urgente o en una sala de emergencias.  Si tiene Engineering geologist, por favor llame inmediatamente al 911 o vaya a la sala de emergencias.  Nmeros de bper  - Dr. Nehemiah Massed: [phone]  - Dra. Moye: [phone]  - Dra. Nicole Kindred: [phone]  En caso de inclemencias del [place], por favor llame a Johnsie Kindred principal al [phone] para una actualizacin sobre el [place] de cualquier retraso o cierre.  Consejos para la medicacin en dermatologa: Por favor, guarde las cajas en las que vienen los medicamentos de uso tpico para ayudarle a  seguir las instrucciones sobre dnde y cmo usarlos. Las farmacias generalmente imprimen las instrucciones del medicamento slo en las cajas y no directamente en los tubos del [place].   Si su medicamento es muy caro, por favor, pngase en contacto con Zigmund Daniel llamando al [phone] y presione la opcin 4 o envenos un mensaje a travs de Pharmacist, community.   No podemos decirle cul ser su copago por los medicamentos por adelantado ya que esto es diferente dependiendo de la cobertura de su seguro. Sin embargo, es posible que podamos encontrar un medicamento sustituto a Electrical engineer un formulario para que el seguro cubra el medicamento que se considera necesario.   Si se requiere una autorizacin previa para que su compaa de seguros [place] su medicamento, por favor permtanos de 1 a 2 das hbiles para completar este proceso.  Los precios de los medicamentos varan con frecuencia dependiendo del Environmental consultant de dnde se surte la receta y alguna farmacias pueden ofrecer precios ms baratos.  El sitio web www.goodrx.com tiene cupones para medicamentos de Airline pilot. Los precios aqu no tienen en cuenta lo que podra costar con la ayuda del seguro (puede ser ms barato con su seguro), pero el sitio web puede darle el precio si no utiliz Research scientist (physical sciences).  - Puede imprimir el cupn correspondiente y llevarlo con su receta a la farmacia.  - Tambin puede pasar por nuestra oficina durante el horario de atencin regular y Charity fundraiser una tarjeta de cupones de GoodRx.  - Si necesita que su receta se enve electrnicamente a una farmacia diferente, informe a nuestra oficina a travs de MyChart de [place] o por telfono llamando al [phone] y presione la opcin 4.

## 2021-10-27 NOTE — Progress Notes (Signed)
° °  New Patient Visit  Subjective  Mario Newman is a 61 y.o. male who presents for the following: New Patient (Initial Visit) (Patient is here concerning a spot at back that has been present for years. Patient denies any pain. Patient's wife noticed and states that it may be growing. Patient reports possibly scratching and it was painful for about a week.  Patient also reports a itchy rash at hands that he gets in August. ).  The following portions of the chart were reviewed this encounter and updated as appropriate:   Tobacco   Allergies   Meds   Problems   Med Hx   Surg Hx   Fam Hx      Review of Systems:  No other skin or systemic complaints except as noted in HPI or Assessment and Plan.  Objective  Well appearing patient in no apparent distress; mood and affect are within normal limits.  A focused examination was performed including upper extremities, including the arms, hands, fingers, and fingernails and the back. Relevant physical exam findings are noted in the Assessment and Plan.  right mid to lower back paraspinal 2.2 cm Subcutaneous nodule.    Assessment & Plan  Epidermal inclusion cyst right mid to lower back paraspinal Growing   Cyst with symptoms and/or recent change.  Discussed surgical excision to remove, including resulting scar and possible recurrence.  Patient will schedule for surgery. Pre-op information given.  Atopic dermatitis, dyshidrotic eczema Right Hand - Anterior  Atopic dermatitis (eczema) is a chronic, relapsing, pruritic condition that can significantly affect quality of life. It is often associated with allergic rhinitis and/or asthma and can require treatment with topical medications, phototherapy, or in severe cases biologic injectable medication (Dupixent; Adbry) or Oral JAK inhibitors. Sample given Lot [id number] exp 8/ 30 /2024 More consistent with eczema   Opzelura cream when flared to affected areas   Ruxolitinib Phosphate (OPZELURA) 1.5 %  CREA - Right Hand - Anterior Apply 1 application topically daily as needed. Apply when needed for flare at palms of hands for rash.  Return for schedule pt for cyst removal surg with Dr. Raliegh Ip. Documentation: I have reviewed the above documentation for accuracy and completeness, and I agree with the above.  Sarina Ser, MD

## 2021-11-01 ENCOUNTER — Encounter: Payer: Self-pay | Admitting: Dermatology

## 2021-12-13 ENCOUNTER — Other Ambulatory Visit: Payer: Self-pay

## 2021-12-13 ENCOUNTER — Ambulatory Visit: Payer: Managed Care, Other (non HMO) | Admitting: Dermatology

## 2021-12-13 DIAGNOSIS — D492 Neoplasm of unspecified behavior of bone, soft tissue, and skin: Secondary | ICD-10-CM

## 2021-12-13 DIAGNOSIS — L720 Epidermal cyst: Secondary | ICD-10-CM

## 2021-12-13 MED ORDER — MUPIROCIN 2 % EX OINT: 1.0000 "application " | TOPICAL_OINTMENT | Freq: Every day | CUTANEOUS | 1 refills | Status: DC

## 2021-12-13 NOTE — Patient Instructions (Signed)

## 2021-12-13 NOTE — Progress Notes (Signed)
? ?  Follow-Up Visit ?  ?Subjective  ?Mario Newman is a 61 y.o. male who presents for the following: Cyst (R mid to lower back paraspinal, hx of growing, pt presents for excision). ? ?The following portions of the chart were reviewed this encounter and updated as appropriate:  ? Tobacco  Allergies  Meds  Problems  Med Hx  Surg Hx  Fam Hx   ?  ?Review of Systems:  No other skin or systemic complaints except as noted in HPI or Assessment and Plan. ? ?Objective  ?Well appearing patient in no apparent distress; mood and affect are within normal limits. ? ?A focused examination was performed including back. Relevant physical exam findings are noted in the Assessment and Plan. ? ?R mid to lower back paraspinal ?Cystic pap 3.2 x 2.0cm ? ? ?Assessment & Plan  ?Neoplasm of skin ?R mid to lower back paraspinal ? ?Skin excision ? ?Lesion length (cm):  3.2 ?Lesion width (cm):  2 ?Margin per side (cm):  0 ?Total excision diameter (cm):  3.2 ?Informed consent: discussed and consent obtained   ?Timeout: patient name, date of birth, surgical site, and procedure verified   ?Procedure prep:  Patient was prepped and draped in usual sterile fashion ?Prep type:  Isopropyl alcohol and povidone-iodine ?Anesthesia: the lesion was anesthetized in a standard fashion   ?Anesthetic:  1% lidocaine w/ epinephrine 1-100,000 buffered w/ 8.4% NaHCO3 (6cc lido w/ epi, 3cc bupivicaine, Total of 9cc) ?Instrument used: #15 blade   ?Hemostasis achieved with: pressure   ?Hemostasis achieved with comment:  Electrocautery ?Outcome: patient tolerated procedure well with no complications   ?Post-procedure details: sterile dressing applied and wound care instructions given   ?Dressing type: bandage and pressure dressing (Mupirocin)   ? ?Skin repair ?Complexity:  Complex ?Final length (cm):  4 ?Reason for type of repair: reduce tension to allow closure, reduce the risk of dehiscence, infection, and necrosis, reduce subcutaneous dead space and avoid a  hematoma, allow closure of the large defect, preserve normal anatomy, preserve normal anatomical and functional relationships and enhance both functionality and cosmetic results   ?Undermining: area extensively undermined   ?Undermining comment:  Undermining Defect 3.2cm ?Subcutaneous layers (deep stitches):  ?Suture size:  2-0 ?Suture type: Vicryl (polyglactin 910)   ?Subcutaneous suture technique: Inverted Dermal. ?Fine/surface layer approximation (top stitches):  ?Suture size:  2-0 ?Stitches: simple running   ?Suture removal (days):  7 ?Hemostasis achieved with: pressure ?Outcome: patient tolerated procedure well with no complications   ?Post-procedure details: sterile dressing applied and wound care instructions given   ?Dressing type: bandage, pressure dressing and bacitracin (Mupirocin)   ? ?mupirocin ointment (BACTROBAN) 2 % ?Apply 1 application. topically daily. Qd to excision site ? ?Specimen 1 - Surgical pathology ?Differential Diagnosis: D48.5 Cyst vs other ?Check Margins: No ?Cystic pap ?Cyst vs other, excised today ? ?Return in about 1 week (around 12/20/2021) for suture removal. ? ?I, Othelia Pulling, RMA, am acting as scribe for Sarina Ser, MD . ?Documentation: I have reviewed the above documentation for accuracy and completeness, and I agree with the above. ? ?Sarina Ser, MD ? ?

## 2021-12-14 ENCOUNTER — Telehealth: Payer: Self-pay

## 2021-12-14 NOTE — Telephone Encounter (Signed)
Pt doing fine after yesterdays surgery./sh 

## 2021-12-18 ENCOUNTER — Encounter: Payer: Self-pay | Admitting: Dermatology

## 2021-12-20 ENCOUNTER — Other Ambulatory Visit: Payer: Self-pay

## 2021-12-20 ENCOUNTER — Ambulatory Visit (INDEPENDENT_AMBULATORY_CARE_PROVIDER_SITE_OTHER): Payer: Managed Care, Other (non HMO) | Admitting: Dermatology

## 2021-12-20 DIAGNOSIS — L905 Scar conditions and fibrosis of skin: Secondary | ICD-10-CM

## 2021-12-20 NOTE — Progress Notes (Signed)
? ?  Follow-Up Visit ?  ?Subjective  ?Mario Newman is a 61 y.o. male who presents for the following: Suture / Staple Removal (1 week f/u right mid to lower back paraspinal- biopsy proven Cyst ). ? ?The following portions of the chart were reviewed this encounter and updated as appropriate:  ? Tobacco  Allergies  Meds  Problems  Med Hx  Surg Hx  Fam Hx   ?  ?Review of Systems:  No other skin or systemic complaints except as noted in HPI or Assessment and Plan. ? ?Objective  ?Well appearing patient in no apparent distress; mood and affect are within normal limits. ? ?A focused examination was performed including back. Relevant physical exam findings are noted in the Assessment and Plan. ? ?right mid to lower back paraspinal ?Well healed scar  ? ? ?Assessment & Plan  ?Well-healed wound at site of cyst excision ?right mid to lower back paraspinal ? ?Biopsy results discussed- epidermal cyst  ? ?Encounter for Removal of Sutures ?- Incision site at the Epidermal inclusion cyst is clean, dry and intact ?- Wound cleansed, sutures removed, wound cleansed and steri strips applied.  ?- Discussed pathology results showing right mid to low back paraspinal  ?- Patient advised to keep steri-strips dry until they fall off. ?- Scars remodel for a full year. ?- Once steri-strips fall off, patient can apply over-the-counter silicone scar cream each night to help with scar remodeling if desired. ?- Patient advised to call with any concerns or if they notice any new or changing lesions.  ? ?Return if symptoms worsen or fail to improve. ? ?I, Mario Newman, CMA, am acting as scribe for Mario Ser, MD .  ?Documentation: I have reviewed the above documentation for accuracy and completeness, and I agree with the above. ? ?Mario Ser, MD ? ?

## 2021-12-20 NOTE — Patient Instructions (Signed)

## 2021-12-30 ENCOUNTER — Encounter: Payer: Self-pay | Admitting: Dermatology

## 2022-01-20 ENCOUNTER — Other Ambulatory Visit: Payer: Self-pay | Admitting: Physician Assistant

## 2022-01-20 DIAGNOSIS — K2100 Gastro-esophageal reflux disease with esophagitis, without bleeding: Secondary | ICD-10-CM

## 2022-01-23 ENCOUNTER — Encounter: Payer: Self-pay | Admitting: Physician Assistant

## 2022-01-23 ENCOUNTER — Ambulatory Visit: Payer: Managed Care, Other (non HMO) | Admitting: Physician Assistant

## 2022-01-23 VITALS — BP 124/82 | HR 78 | Temp 98.2°F | Ht 68.0 in | Wt 208.0 lb

## 2022-01-23 DIAGNOSIS — F339 Major depressive disorder, recurrent, unspecified: Secondary | ICD-10-CM

## 2022-01-23 DIAGNOSIS — K2100 Gastro-esophageal reflux disease with esophagitis, without bleeding: Secondary | ICD-10-CM | POA: Diagnosis not present

## 2022-01-23 DIAGNOSIS — N529 Male erectile dysfunction, unspecified: Secondary | ICD-10-CM | POA: Diagnosis not present

## 2022-01-23 DIAGNOSIS — E7800 Pure hypercholesterolemia, unspecified: Secondary | ICD-10-CM | POA: Diagnosis not present

## 2022-01-23 DIAGNOSIS — I10 Essential (primary) hypertension: Secondary | ICD-10-CM

## 2022-01-23 DIAGNOSIS — L309 Dermatitis, unspecified: Secondary | ICD-10-CM

## 2022-01-23 DIAGNOSIS — R7303 Prediabetes: Secondary | ICD-10-CM

## 2022-01-23 MED ORDER — TADALAFIL 5 MG PO TABS: 5.0000 mg | ORAL_TABLET | Freq: Every day | ORAL | 0 refills | Status: DC | PRN

## 2022-01-23 NOTE — Assessment & Plan Note (Signed)
-  BP elevated on intake, BP repeated and improved. Stable. ?-Continue current medication regimen.  ?Will collect CMP for medication monitoring. ?-Will continue to monitor. ?

## 2022-01-23 NOTE — Patient Instructions (Signed)

## 2022-01-23 NOTE — Assessment & Plan Note (Signed)
-  Stable. Discussed with patient can trial taking omeprazole 20 mg every other day, if symptoms flare-up can resume daily dosing. Will continue to monitor. ?

## 2022-01-23 NOTE — Assessment & Plan Note (Signed)
-  Last lipid panel: HDL 39, LDL 76 ?-Continue current medication regimen. See med list. ?-Recommend to follow a heart healthy diet low in fat. ?-Will continue to monitor. ?

## 2022-01-23 NOTE — Progress Notes (Signed)
?Established patient visit ? ? ?Patient: Mario Newman   DOB: 04/28/1961   61 y.o. Male  MRN: [id number] ?Visit Date: 01/23/2022 ? ?Chief Complaint  ?Patient presents with  ? Follow-up  ? Hypertension  ? Hyperlipidemia  ? ?Subjective  ?  ?HPI  ?Patient presents for follow-up on hypertension, GERD and elevated LDL cholesterol. Patient c/o bilateral ear itching. No fever or otorrhea.  ? ?HTN: Pt denies chest pain, palpitations, dizziness or lower extremity swelling. Taking medication as directed without side effects. Checks. Pt follows a low salt diet. ? ?HLD: Pt taking medication as directed without issues. Denies diet changes since last visit. Reports plans to restart exercising with his stationary bike.  ? ?Mood: Reports medication compliance. Denies mood swings, SI/HI. States has been under some stress with finances.  ? ?ED: Patient reports takes medication few times per week. Denies palpitations, chest pain or shortness of breath. ? ?GERD: Patient reports takes omeprazole 20 mg daily which has helped with acid reflux.  ? ? ?  01/23/2022  ?  8:30 AM 07/25/2021  ?  8:59 AM 04/21/2021  ?  8:17 AM 11/08/2020  ?  1:58 PM 07/22/2020  ?  2:37 PM  ?Depression screen PHQ 2/9  ?Decreased Interest 0 0 0 0 0  ?Down, Depressed, Hopeless 0 0 0 0 0  ?PHQ - 2 Score 0 0 0 0 0  ?Altered sleeping 0 0 0 0 0  ?Tired, decreased energy 1 1 0 0 1  ?Change in appetite 0 0 0 0 0  ?Feeling bad or failure about yourself  0 0 0 0 0  ?Trouble concentrating 0 0 0 0 0  ?Moving slowly or fidgety/restless 0 0 0 0 0  ?Suicidal thoughts 0 0 0 0 0  ?PHQ-9 Score 1 1 0 0 1  ?Difficult doing work/chores Not difficult at all Not difficult at all   Not difficult at all  ? ? ?  01/23/2022  ?  8:30 AM 07/25/2021  ?  9:00 AM  ?GAD 7 : Generalized Anxiety Score  ?Nervous, Anxious, on Edge 0 0  ?Control/stop worrying 0 0  ?Worry too much - different things 1 0  ?Trouble relaxing 0 0  ?Restless 0 0  ?Easily annoyed or irritable 1 1  ?Afraid - awful might happen 0  0  ?Total GAD 7 Score 2 1  ?Anxiety Difficulty Not difficult at all Not difficult at all  ? ? ? ? ?Medications: ?Outpatient Medications Prior to Visit  ?Medication Sig  ? atorvastatin (LIPITOR) 40 MG tablet TAKE 1 TABLET BY MOUTH EVERY DAY AT 6 PM  ? citalopram (CELEXA) 40 MG tablet TAKE 1 TABLET BY MOUTH EVERY DAY  ? lisinopril (ZESTRIL) 10 MG tablet TAKE 1 TABLET BY MOUTH EVERY DAY  ? mupirocin ointment (BACTROBAN) 2 % Apply 1 application. topically daily. Qd to excision site  ? omeprazole (PRILOSEC) 20 MG capsule TAKE 1 CAPSULE BY MOUTH EVERY DAY  ? Ruxolitinib Phosphate (OPZELURA) 1.5 % CREA Apply 1 application topically daily as needed. Apply when needed for flare at palms of hands for rash.  ? [DISCONTINUED] tadalafil (CIALIS) 5 MG tablet Take 1 tablet (5 mg total) by mouth daily as needed for erectile dysfunction.  ? ?No facility-administered medications prior to visit.  ? ? ?Review of Systems ?Review of Systems:  ?A fourteen system review of systems was performed and found to be positive as per HPI. ? ? ? ?  Objective  ?  ?BP 124/82     Pulse 78   Temp 98.2 ?F (36.8 ?C)   Ht 5' 8" (1.727 m)   Wt 208 lb (94.3 kg)   SpO2 98%   BMI 31.63 kg/m?  ?BP Readings from Last 3 Encounters:  ?01/23/22 124/82  ?07/25/21 123/86  ?04/21/21 129/79  ? ?Wt Readings from Last 3 Encounters:  ?01/23/22 208 lb (94.3 kg)  ?07/25/21 199 lb 3.2 oz (90.4 kg)  ?04/21/21 206 lb 3.2 oz (93.5 kg)  ? ? ?Physical Exam  ?General:  Well Developed, well nourished, appropriate for stated age.  ?Neuro:  Alert and oriented,  extra-ocular muscles intact  ?HEENT:  Normocephalic, atraumatic, no sinus tenderness, normal TM's of both ears, dry and scaly lesions of both external ears noted, neck supple  ?Skin:  no gross rash, warm, pink. ?Cardiac:  RRR, S1 S2 ?Respiratory: CTA B/L  ?Vascular:  Ext warm, no cyanosis apprec.; cap RF less 2 sec. ?Psych:  No HI/SI, judgement and insight good, Euthymic mood. Full Affect. ? ? ?No results found for any  visits on 01/23/22. ? Assessment & Plan  ?  ? ? ?Problem List Items Addressed This Visit   ? ?  ? Cardiovascular and Mediastinum  ? Essential hypertension - Primary  ?  -BP elevated on intake, BP repeated and improved. Stable. ?-Continue current medication regimen.  ?Will collect CMP for medication monitoring. ?-Will continue to monitor. ? ?  ?  ? Relevant Medications  ? tadalafil (CIALIS) 5 MG tablet  ? Other Relevant Orders  ? Comp Met (CMET)  ? CBC w/Diff  ?  ? Digestive  ? Gastroesophageal reflux disease with esophagitis  ?  -Stable. Discussed with patient can trial taking omeprazole 20 mg every other day, if symptoms flare-up can resume daily dosing. Will continue to monitor. ? ?  ?  ?  ? Other  ? Depression, recurrent (HCC)  ?  -Stable. PHQ-9 score of 1. ?-Continue current medication regimen. ?-Will continue to monitor. ? ?  ?  ? Elevated LDL cholesterol level  ?  -Last lipid panel: HDL 39, LDL 76 ?-Continue current medication regimen. See med list. ?-Recommend to follow a heart healthy diet low in fat. ?-Will continue to monitor. ? ?  ?  ? Relevant Orders  ? Comp Met (CMET)  ? CBC w/Diff  ? Erectile dysfunction  ?  -Stable. ?-Continue tadalafil as needed. ?-Will continue to monitor. ? ?  ?  ? Relevant Medications  ? tadalafil (CIALIS) 5 MG tablet  ? Prediabetes  ?  -Last A1c 6.3, will repeat A1c today. Recommend to follow a low carbohydrate and sugar diet. ? ?  ?  ? Relevant Orders  ? HgB A1c  ? ?Other Visit Diagnoses   ? ? Dermatitis of external ear      ? ?  ? ?Dermatitis of external ear: ?-Advised to apply OTC cortisone cream for itching relief and apply daily skin emollient.   ? ?Return in about 6 months (around 07/26/2022) for CPE and FBW.  ?   ? ? ? ?Maritza Abonza, PA-C  ?[facility] [place] [facility] ?[phone] (phone) ?[phone] (fax) ? ?[facility] ?

## 2022-01-23 NOTE — Assessment & Plan Note (Signed)
-  Stable. PHQ-9 score of 1. ?-Continue current medication regimen. ?-Will continue to monitor. ?

## 2022-01-23 NOTE — Assessment & Plan Note (Signed)
-  Last A1c 6.3, will repeat A1c today. Recommend to follow a low carbohydrate and sugar diet. ?

## 2022-01-23 NOTE — Assessment & Plan Note (Signed)
-  Stable. ?-Continue tadalafil as needed. ?-Will continue to monitor. ?

## 2022-01-24 LAB — CBC WITH DIFFERENTIAL/PLATELET
Basophils Absolute: 0 10*3/uL (ref 0.0–0.2)
Basos: 1 %
EOS (ABSOLUTE): 0.1 10*3/uL (ref 0.0–0.4)
Eos: 2 %
Hematocrit: 45.2 % (ref 37.5–51.0)
Hemoglobin: 14.2 g/dL (ref 13.0–17.7)
Immature Grans (Abs): 0 10*3/uL (ref 0.0–0.1)
Immature Granulocytes: 0 %
Lymphocytes Absolute: 2.5 10*3/uL (ref 0.7–3.1)
Lymphs: 38 %
MCH: 23.3 pg — ABNORMAL LOW (ref 26.6–33.0)
MCHC: 31.4 g/dL — ABNORMAL LOW (ref 31.5–35.7)
MCV: 74 fL — ABNORMAL LOW (ref 79–97)
Monocytes Absolute: 0.6 10*3/uL (ref 0.1–0.9)
Monocytes: 9 %
Neutrophils Absolute: 3.4 10*3/uL (ref 1.4–7.0)
Neutrophils: 50 %
Platelets: 300 10*3/uL (ref 150–450)
RBC: 6.1 x10E6/uL — ABNORMAL HIGH (ref 4.14–5.80)
RDW: 17.1 % — ABNORMAL HIGH (ref 11.6–15.4)
WBC: 6.6 10*3/uL (ref 3.4–10.8)

## 2022-01-24 LAB — COMPREHENSIVE METABOLIC PANEL
ALT: 24 IU/L (ref 0–44)
AST: 23 IU/L (ref 0–40)
Albumin/Globulin Ratio: 1.9 (ref 1.2–2.2)
Albumin: 4.3 g/dL (ref 3.8–4.9)
Alkaline Phosphatase: 75 IU/L (ref 44–121)
BUN/Creatinine Ratio: 10 (ref 10–24)
BUN: 11 mg/dL (ref 8–27)
Bilirubin Total: 0.3 mg/dL (ref 0.0–1.2)
CO2: 19 mmol/L — ABNORMAL LOW (ref 20–29)
Calcium: 9.3 mg/dL (ref 8.6–10.2)
Chloride: 107 mmol/L — ABNORMAL HIGH (ref 96–106)
Creatinine, Ser: 1.09 mg/dL (ref 0.76–1.27)
Globulin, Total: 2.3 g/dL (ref 1.5–4.5)
Glucose: 108 mg/dL — ABNORMAL HIGH (ref 70–99)
Potassium: 4.3 mmol/L (ref 3.5–5.2)
Sodium: 142 mmol/L (ref 134–144)
Total Protein: 6.6 g/dL (ref 6.0–8.5)
eGFR: 78 mL/min/{1.73_m2} (ref >59)

## 2022-01-24 LAB — HEMOGLOBIN A1C
Est. average glucose Bld gHb Est-mCnc: 137 mg/dL
Hgb A1c MFr Bld: 6.4 % — ABNORMAL HIGH (ref 4.8–5.6)

## 2022-01-25 ENCOUNTER — Other Ambulatory Visit: Payer: Self-pay | Admitting: Physician Assistant

## 2022-01-29 ENCOUNTER — Other Ambulatory Visit: Payer: Self-pay | Admitting: Physician Assistant

## 2022-02-20 ENCOUNTER — Other Ambulatory Visit: Payer: Self-pay | Admitting: Physician Assistant

## 2022-02-20 DIAGNOSIS — Z0000 Encounter for general adult medical examination without abnormal findings: Secondary | ICD-10-CM

## 2022-02-20 DIAGNOSIS — R7303 Prediabetes: Secondary | ICD-10-CM

## 2022-02-20 DIAGNOSIS — I10 Essential (primary) hypertension: Secondary | ICD-10-CM

## 2022-02-20 DIAGNOSIS — E7800 Pure hypercholesterolemia, unspecified: Secondary | ICD-10-CM

## 2022-02-21 ENCOUNTER — Other Ambulatory Visit: Payer: Managed Care, Other (non HMO)

## 2022-02-21 DIAGNOSIS — R7989 Other specified abnormal findings of blood chemistry: Secondary | ICD-10-CM

## 2022-02-22 LAB — CBC
Hematocrit: 43.5 % (ref 37.5–51.0)
Hemoglobin: 14.3 g/dL (ref 13.0–17.7)
MCH: 24.2 pg — ABNORMAL LOW (ref 26.6–33.0)
MCHC: 32.9 g/dL (ref 31.5–35.7)
MCV: 74 fL — ABNORMAL LOW (ref 79–97)
Platelets: 283 10*3/uL (ref 150–450)
RBC: 5.9 x10E6/uL — ABNORMAL HIGH (ref 4.14–5.80)
RDW: 16.7 % — ABNORMAL HIGH (ref 11.6–15.4)
WBC: 6.6 10*3/uL (ref 3.4–10.8)

## 2022-02-22 LAB — B12 AND FOLATE PANEL
Folate: 14.2 ng/mL (ref >3.0)
Vitamin B-12: 527 pg/mL (ref 232–1245)

## 2022-02-22 LAB — IRON,TIBC AND FERRITIN PANEL
Ferritin: 34 ng/mL (ref 30–400)
Iron Saturation: 25 % (ref 15–55)
Iron: 73 ug/dL (ref 38–169)
Total Iron Binding Capacity: 296 ug/dL (ref 250–450)
UIBC: 223 ug/dL (ref 111–343)

## 2022-02-22 LAB — RETICULOCYTES: Retic Ct Pct: 1.3 % (ref 0.6–2.6)

## 2022-04-19 ENCOUNTER — Other Ambulatory Visit: Payer: Self-pay | Admitting: Physician Assistant

## 2022-04-19 DIAGNOSIS — K2100 Gastro-esophageal reflux disease with esophagitis, without bleeding: Secondary | ICD-10-CM

## 2022-04-27 ENCOUNTER — Other Ambulatory Visit: Payer: Self-pay | Admitting: Physician Assistant

## 2022-05-29 IMAGING — CT CT CHEST LUNG CANCER SCREENING LOW DOSE W/O CM
1 series · 16 of 31 positions shown, 20 images · non-contrast
Comparison: None.

CLINICAL DATA: Lung cancer screening. Twenty-three pack-year
history. Current smoker.

EXAM:
CT CHEST WITHOUT CONTRAST LOW-DOSE FOR LUNG CANCER SCREENING
TECHNIQUE: Multidetector CT imaging of the chest was performed following the
standard protocol without IV contrast.

[Series 2: ldct screen -soft · axial · 0.76mm/px · z∈[-280,+60]mm · 16 of 74 slices shown, 20 images]
[im 3/74  mediastinal]
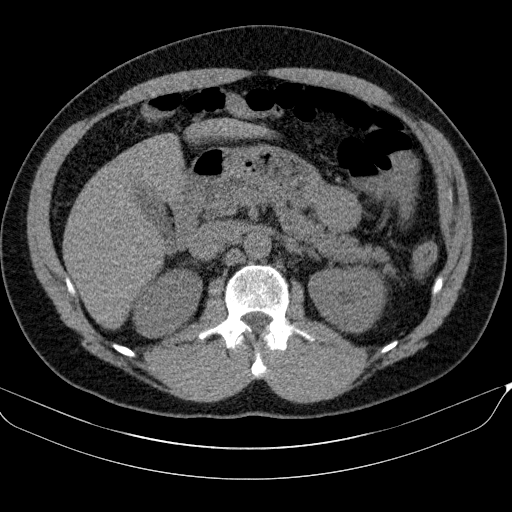
[im 3/74  lung]
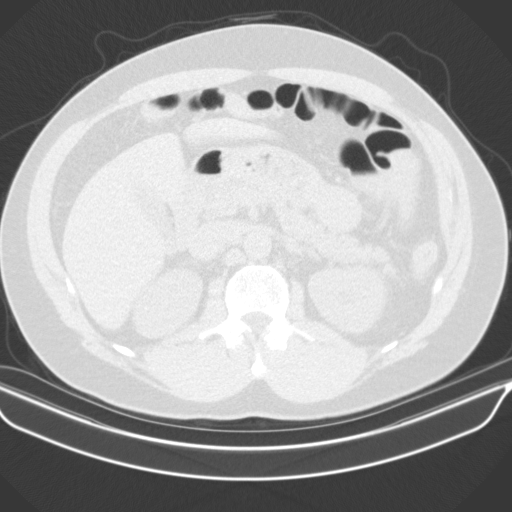
[im 9/74  lung]
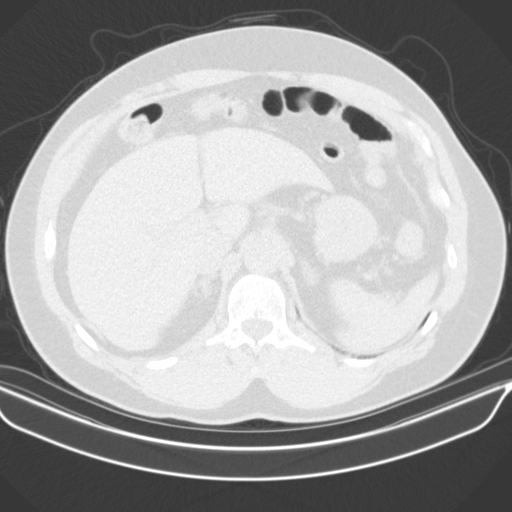
[im 14/74  lung]
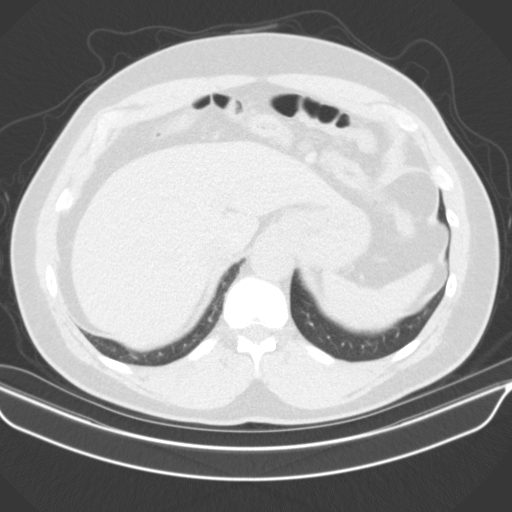
[im 17/74  lung]
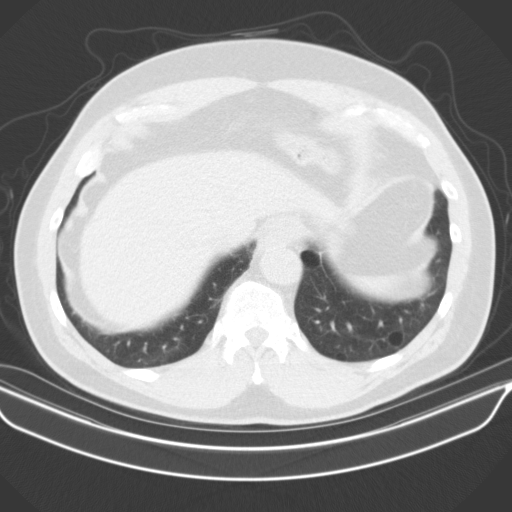
[im 22/74  mediastinal]
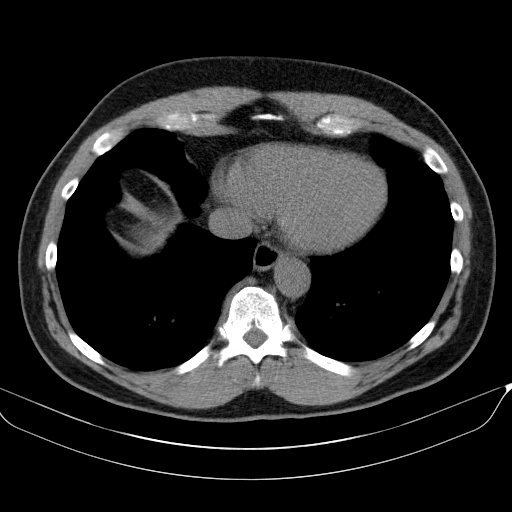
[im 22/74  lung]
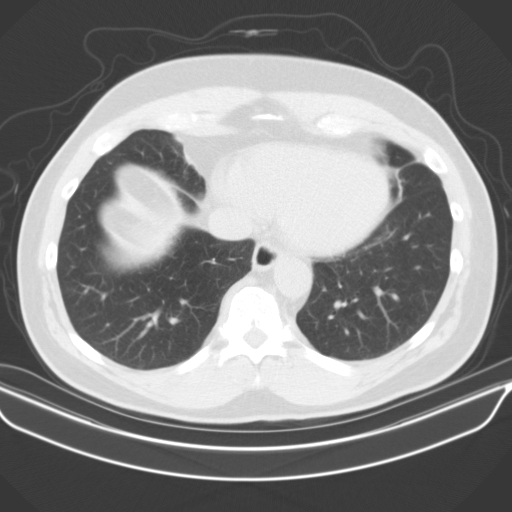
[im 25/74  lung]
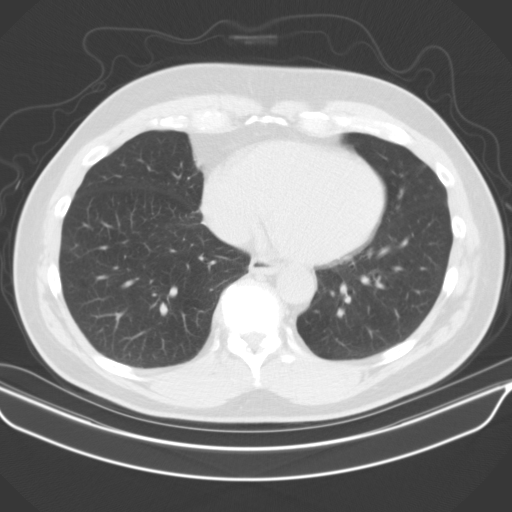
[im 30/74  lung]
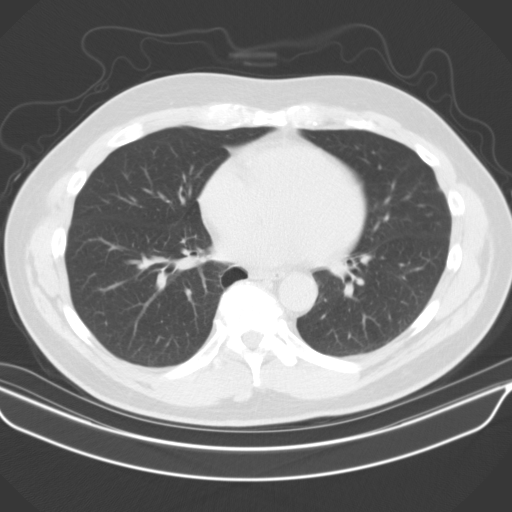
[im 36/74  lung]
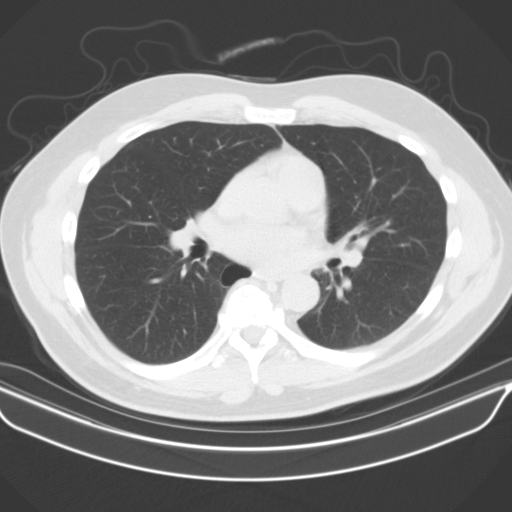
[im 39/74  mediastinal]
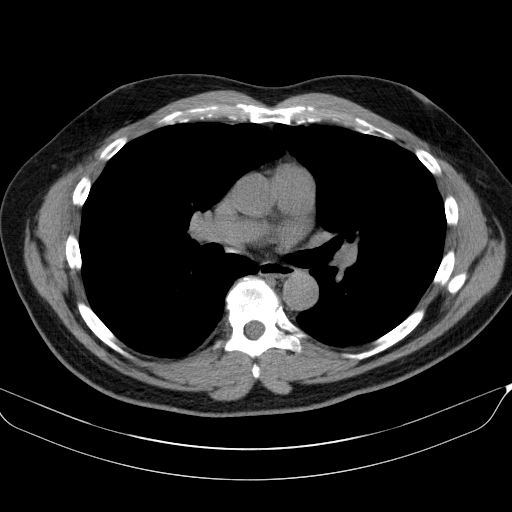
[im 39/74  lung]
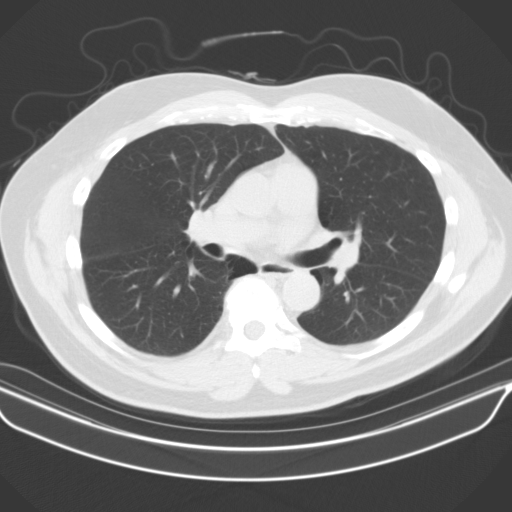
[im 44/74  lung]
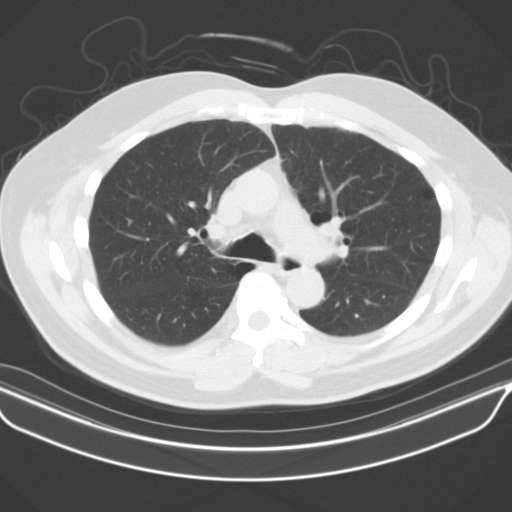
[im 49/74  lung]
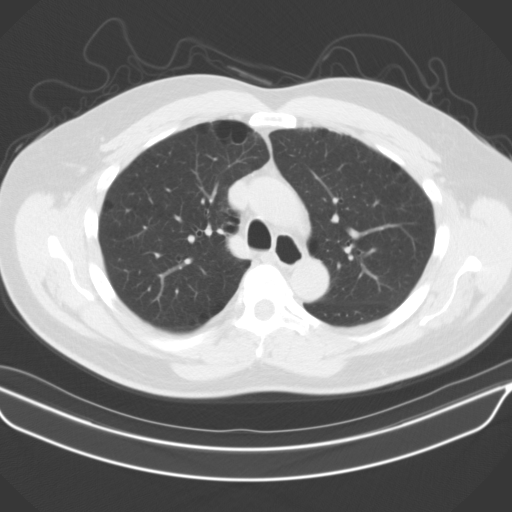
[im 52/74  lung]
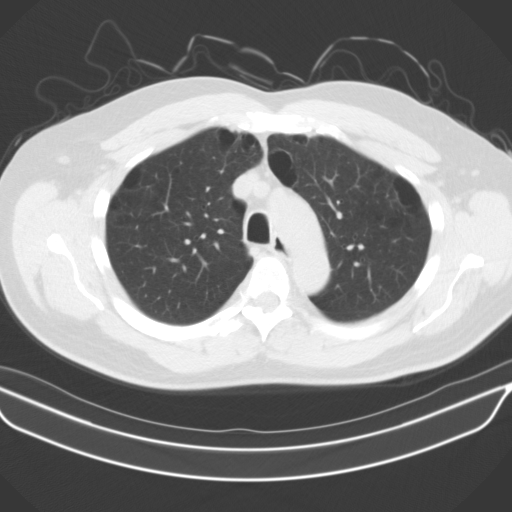
[im 57/74  mediastinal]
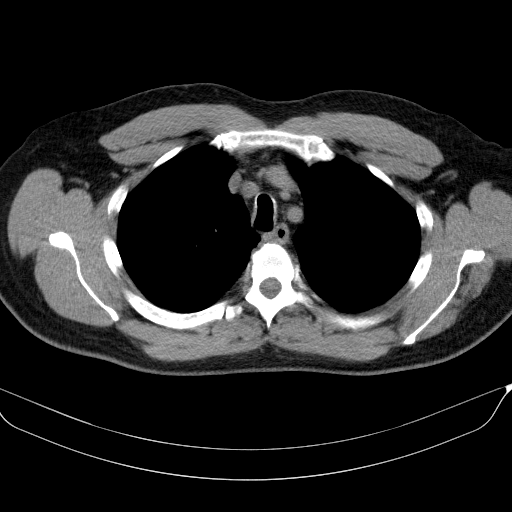
[im 57/74  lung]
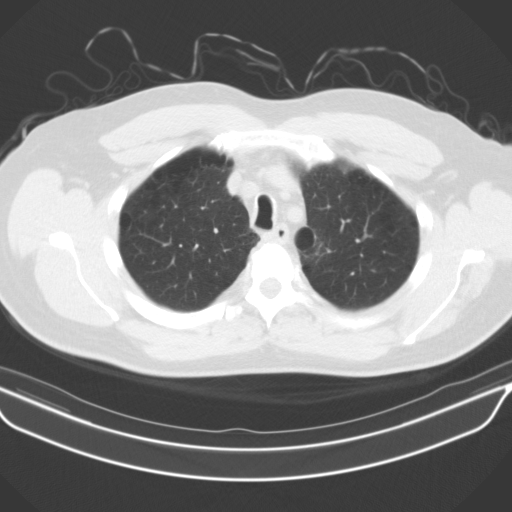
[im 60/74  lung]
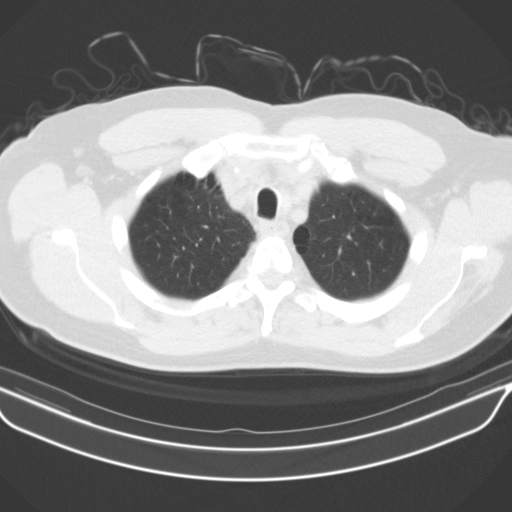
[im 65/74  lung]
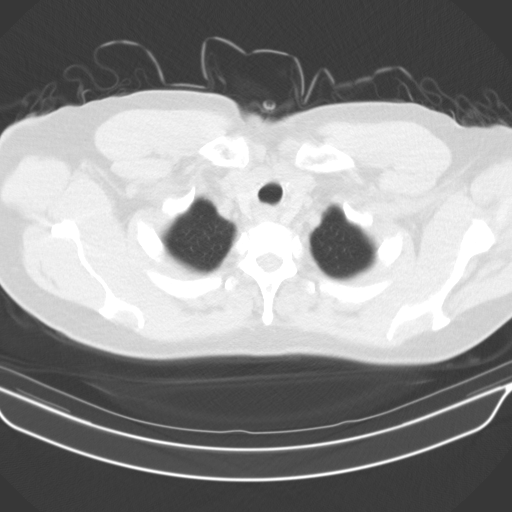
[im 71/74  lung]
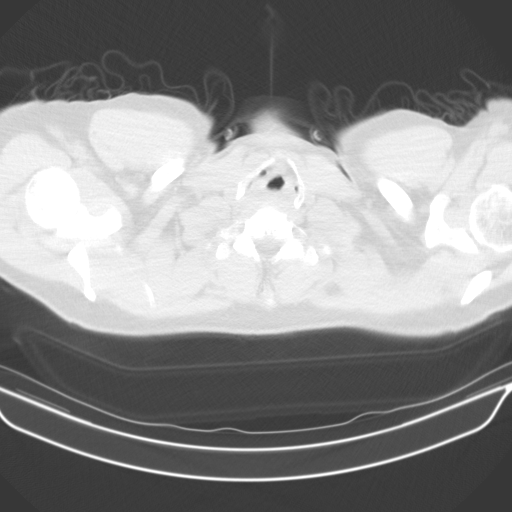

[16 of 31 positions shown; findings below may reference images not displayed]

FINDINGS: Cardiovascular: No significant vascular findings. Normal heart size.
No pericardial effusion.

Mediastinum/Nodes: No enlarged mediastinal, hilar, or axillary lymph
nodes. Thyroid gland, trachea, and esophagus demonstrate no
significant findings.

Lungs/Pleura: No pleural effusion, airspace consolidation, or
pneumothorax. Paraseptal and centrilobular emphysema. No suspicious
lung nodules identified.

Upper Abdomen: No acute abnormality.

Musculoskeletal: No chest wall mass or suspicious bone lesions
identified.
IMPRESSION: 1. Lung-RADS 1, negative. Continue annual screening with low-dose
chest CT without contrast in 12 months.
2. Emphysema ([id number]).

## 2022-07-13 ENCOUNTER — Ambulatory Visit (INDEPENDENT_AMBULATORY_CARE_PROVIDER_SITE_OTHER): Payer: Managed Care, Other (non HMO) | Admitting: Physician Assistant

## 2022-07-13 ENCOUNTER — Encounter: Payer: Self-pay | Admitting: Physician Assistant

## 2022-07-13 VITALS — BP 115/74 | HR 74 | Ht 68.0 in | Wt 205.8 lb

## 2022-07-13 DIAGNOSIS — Z23 Encounter for immunization: Secondary | ICD-10-CM | POA: Diagnosis not present

## 2022-07-13 NOTE — Progress Notes (Signed)
Patient is here for his Flu vaccine pt tolerated injection

## 2022-07-16 ENCOUNTER — Other Ambulatory Visit: Payer: Self-pay | Admitting: Physician Assistant

## 2022-07-16 DIAGNOSIS — K2100 Gastro-esophageal reflux disease with esophagitis, without bleeding: Secondary | ICD-10-CM

## 2022-07-24 ENCOUNTER — Other Ambulatory Visit: Payer: Self-pay | Admitting: Physician Assistant

## 2022-07-24 DIAGNOSIS — N529 Male erectile dysfunction, unspecified: Secondary | ICD-10-CM

## 2022-07-24 MED ORDER — TADALAFIL 5 MG PO TABS: 5.0000 mg | ORAL_TABLET | Freq: Every day | ORAL | 0 refills | Status: AC | PRN

## 2022-07-26 ENCOUNTER — Ambulatory Visit (INDEPENDENT_AMBULATORY_CARE_PROVIDER_SITE_OTHER): Payer: Managed Care, Other (non HMO) | Admitting: Physician Assistant

## 2022-07-26 ENCOUNTER — Encounter: Payer: Self-pay | Admitting: Physician Assistant

## 2022-07-26 VITALS — BP 131/86 | HR 67 | Temp 97.4°F | Ht 68.0 in | Wt 201.0 lb

## 2022-07-26 DIAGNOSIS — Z0000 Encounter for general adult medical examination without abnormal findings: Secondary | ICD-10-CM

## 2022-07-26 DIAGNOSIS — E7800 Pure hypercholesterolemia, unspecified: Secondary | ICD-10-CM

## 2022-07-26 DIAGNOSIS — I10 Essential (primary) hypertension: Secondary | ICD-10-CM

## 2022-07-26 DIAGNOSIS — F339 Major depressive disorder, recurrent, unspecified: Secondary | ICD-10-CM

## 2022-07-26 DIAGNOSIS — Z720 Tobacco use: Secondary | ICD-10-CM

## 2022-07-26 MED ORDER — CITALOPRAM HYDROBROMIDE 40 MG PO TABS: 40.0000 mg | ORAL_TABLET | Freq: Every day | ORAL | 1 refills | Status: DC

## 2022-07-26 MED ORDER — LISINOPRIL 10 MG PO TABS: 10.0000 mg | ORAL_TABLET | Freq: Every day | ORAL | 0 refills | Status: DC

## 2022-07-26 MED ORDER — NICOTINE 21 MG/24HR TD PT24: 21.0000 mg | MEDICATED_PATCH | Freq: Every day | TRANSDERMAL | 0 refills | Status: DC

## 2022-07-26 MED ORDER — ATORVASTATIN CALCIUM 40 MG PO TABS
ORAL_TABLET | ORAL | 1 refills | Status: DC
Start: 2022-07-26 — End: 2023-02-21

## 2022-07-26 NOTE — Progress Notes (Signed)
Complete physical exam   Patient: Mario Newman   DOB: 12-18-1960   61 y.o. Male  MRN: [id number] Visit Date: 07/26/2022   Chief Complaint  Patient presents with   Annual Exam   Subjective    Mario Newman is a 60 y.o. male who presents today for a complete physical exam.  He reports consuming a general diet. The patient does not participate in regular exercise at present. He generally feels fairly well. He does not have additional problems to discuss today.    No past medical history on file. No past surgical history on file. Social History   Socioeconomic History   Marital status: Married    Spouse name: Not on file   Number of children: Not on file   Years of education: Not on file   Highest education level: Not on file  Occupational History   Not on file  Tobacco Use   Smoking status: Every Day    Packs/day: 1.00    Years: 20.00    Total pack years: 20.00    Types: Cigarettes   Smokeless tobacco: Never  Vaping Use   Vaping Use: Every day  Substance and Sexual Activity   Alcohol use: No   Drug use: No   Sexual activity: Yes    Birth control/protection: None  Other Topics Concern   Not on file  Social History Narrative   Not on file   Social Determinants of Health   Financial Resource Strain: Not on file  Food Insecurity: Not on file  Transportation Needs: Not on file  Physical Activity: Not on file  Stress: Not on file  Social Connections: Not on file  Intimate Partner Violence: Not on file     Medications: Outpatient Medications Prior to Visit  Medication Sig   mupirocin ointment (BACTROBAN) 2 % Apply 1 application. topically daily. Qd to excision site   omeprazole (PRILOSEC) 20 MG capsule TAKE 1 CAPSULE BY MOUTH EVERY DAY   Ruxolitinib Phosphate (OPZELURA) 1.5 % CREA Apply 1 application topically daily as needed. Apply when needed for flare at palms of hands for rash.   tadalafil (CIALIS) 5 MG tablet Take 1 tablet (5 mg total) by mouth  daily as needed for erectile dysfunction.   [DISCONTINUED] atorvastatin (LIPITOR) 40 MG tablet TAKE 1 TABLET BY MOUTH EVERY DAY AT 6 PM   [DISCONTINUED] citalopram (CELEXA) 40 MG tablet TAKE 1 TABLET BY MOUTH EVERY DAY   [DISCONTINUED] lisinopril (ZESTRIL) 10 MG tablet TAKE 1 TABLET BY MOUTH EVERY DAY   No facility-administered medications prior to visit.    Review of Systems Review of Systems:  A fourteen system review of systems was performed and found to be positive as per HPI.  Last CBC Lab Results  Component Value Date   WBC 6.6 02/21/2022   HGB 14.3 02/21/2022   HCT 43.5 02/21/2022   MCV 74 (L) 02/21/2022   MCH 24.2 (L) 02/21/2022   RDW 16.7 (H) 02/21/2022   PLT 283 57/32/2025   Last metabolic panel Lab Results  Component Value Date   GLUCOSE 108 (H) 01/23/2022   NA 142 01/23/2022   K 4.3 01/23/2022   CL 107 (H) 01/23/2022   CO2 19 (L) 01/23/2022   BUN 11 01/23/2022   CREATININE 1.09 01/23/2022   EGFR 78 01/23/2022   CALCIUM 9.3 01/23/2022   PROT 6.6 01/23/2022   ALBUMIN 4.3 01/23/2022   LABGLOB 2.3 01/23/2022   AGRATIO 1.9 01/23/2022   BILITOT 0.3 01/23/2022   ALKPHOS  75 01/23/2022   AST 23 01/23/2022   ALT 24 01/23/2022   Last lipids Lab Results  Component Value Date   CHOL 128 07/25/2021   HDL 39 (L) 07/25/2021   LDLCALC 76 07/25/2021   LDLDIRECT 56 07/28/2019   TRIG 61 07/25/2021   CHOLHDL 3.3 07/25/2021   Last hemoglobin A1c Lab Results  Component Value Date   HGBA1C 6.4 (H) 01/23/2022   Last thyroid functions Lab Results  Component Value Date   TSH 0.824 07/25/2021   Last vitamin D Lab Results  Component Value Date   VD25OH 47.3 01/19/2020   Last vitamin B12 and Folate Lab Results  Component Value Date   VITAMINB12 527 02/21/2022   FOLATE 14.2 02/21/2022    Objective    BP 131/86   Pulse 67   Temp (!) 97.4 F (36.3 C) (Temporal)   Ht _0  (1.727 m)   Wt 201 lb (91.2 kg)   BMI 30.56 kg/m  BP Readings from Last 3  Encounters:  07/26/22 131/86  07/13/22 115/74  01/23/22 124/82   Wt Readings from Last 3 Encounters:  07/26/22 201 lb (91.2 kg)  07/13/22 205 lb 12.8 oz (93.4 kg)  01/23/22 208 lb (94.3 kg)    Physical Exam   General Appearance:    Alert, cooperative, in no acute distress, appears stated age  Head:    Normocephalic, without obvious abnormality, atraumatic  Eyes:    PERRL, conjunctiva/corneas clear, EOM's intact, fundi    benign, both eyes       Ears:    Normal TM's and external ear canals, both ears  Nose:   Nares normal, septum midline, mucosa normal, no drainage   or sinus tenderness  Throat:   Lips, mucosa, and tongue normal; teeth and gums normal  Neck:   Supple, symmetrical, trachea midline, no adenopathy;       thyroid:  No enlargement/tenderness/nodules; no JVD  Back:     Symmetric, no curvature, ROM normal, no CVA tenderness  Lungs:     Clear to auscultation bilaterally, respirations unlabored  Chest wall:    No tenderness or deformity  Heart:    Normal heart rate. Normal rhythm. No murmurs, rubs, or gallops.  S1 and S2 normal  Abdomen:     Soft, non-tender, bowel sounds active all four quadrants,    no masses, no organomegaly  Genitalia:    deferred  Rectal:    deferred  Extremities:   All extremities are intact. No cyanosis or edema  Pulses:   2+ and symmetric all extremities  Skin:   Skin texture, turgor normal, hyperpigmented scaly lesions of right cervical area noted  Lymph nodes:   Cervical and supraclavicular nodes normal  Neurologic:   CNII-XII grossly intact.      Last depression screening scores    07/26/2022    8:41 AM 01/23/2022    8:30 AM 07/25/2021    8:59 AM  PHQ 2/9 Scores  PHQ - 2 Score 0 0 0  PHQ- 9 Score _1 Last fall risk screening    07/26/2022    8:40 AM  [place] in the past year? 0  Number falls in past yr: 0  Injury with Fall? 0  Risk for fall due to : No Fall Risks  Follow up Falls evaluation completed     No  results found for any visits on 07/26/22.  Assessment & Plan    Routine Health Maintenance and Physical  Exam  Exercise Activities and Dietary recommendations -Discussed heart healthy diet low in fat and carbohydrates. Recommend moderate exercise 150 mins/wk.  Immunization History  Administered Date(s) Administered   Influenza,inj,Quad PF,6+ Mos 10/08/2017, 12/16/2018, 07/28/2019, 07/30/2020, 07/25/2021, 07/13/2022   Janssen (J&J) SARS-COV-2 Vaccination 12/20/2019   Moderna Sars-Covid-2 Vaccination 08/14/2020, 02/11/2021   Tdap 12/16/2018   Zoster Recombinat (Shingrix) 07/28/2019, 01/19/2020    Health Maintenance  Topic Date Due   COVID-19 Vaccine (4 - Booster for Janssen series) 04/08/2021   COLONOSCOPY (Pts 45-65yr Insurance coverage will need to be confirmed)  02/06/2025   TETANUS/TDAP  12/15/2028   INFLUENZA VACCINE  Completed   Hepatitis C Screening  Completed   HIV Screening  Completed   Zoster Vaccines- Shingrix  Completed   HPV VACCINES  Aged Out    Discussed health benefits of physical activity, and encouraged him to engage in regular exercise appropriate for his age and condition.  Problem List Items Addressed This Visit       Cardiovascular and Mediastinum   Essential hypertension   Relevant Medications   atorvastatin (LIPITOR) 40 MG tablet   lisinopril (ZESTRIL) 10 MG tablet     Other   Tobacco use   Relevant Medications   nicotine (NICODERM CQ) 21 mg/24hr patch   Depression, recurrent (HCC)   Relevant Medications   citalopram (CELEXA) 40 MG tablet   Elevated LDL cholesterol level   Relevant Medications   atorvastatin (LIPITOR) 40 MG tablet   Other Visit Diagnoses     Health care maintenance    -  Primary      Advised patient to schedule lab visit for routine fasting labs. UTD flu vaccine, Tdap and colonoscopy. Discussed with patient PPI therapy with Citalopram 40 mg including recommended dose reduction of Citalopram to 20 mg after the age of 670  Recommend repeating EKG in the future to monitor Qtc interval which previously has been normal when reviewing EKG from 2019. If changes with Qtc from baseline then recommend reducing citalopram dose and changing omeprazole to pantoprazole. Will continue to monitor hepatic function. Follow-up with dermatology as scheduled. Patient wants to try NRT for smoking cessation.  Return in about 6 months (around 01/25/2023) for HTN, HLD, mood; lab visit for FBW in 1-4 weeks.       MLorrene Reid PA-C  [phone](phone) [phone](fax)  [place]

## 2022-07-26 NOTE — Patient Instructions (Signed)

## 2022-07-27 ENCOUNTER — Ambulatory Visit: Payer: Managed Care, Other (non HMO) | Admitting: Dermatology

## 2022-07-27 DIAGNOSIS — L2089 Other atopic dermatitis: Secondary | ICD-10-CM

## 2022-07-27 DIAGNOSIS — R21 Rash and other nonspecific skin eruption: Secondary | ICD-10-CM

## 2022-07-27 MED ORDER — MOMETASONE FUROATE 0.1 % EX CREA: TOPICAL_CREAM | CUTANEOUS | 2 refills | Status: AC

## 2022-07-27 NOTE — Patient Instructions (Addendum)
Gentle Skin Care Guide  1. Bathe no more than once a day.  2. Avoid bathing in hot water  3. Use a mild soap like Dove, Vanicream, Cetaphil, CeraVe. Can use Lever 2000 or Cetaphil antibacterial soap  4. Use soap only where you need it. On most days, use it under your arms, between your legs, and on your feet. Let the water rinse other areas unless visibly dirty.  5. When you get out of the bath/shower, use a towel to gently blot your skin dry, don't rub it.  6. While your skin is still a little damp, apply a moisturizing cream such as Vanicream, CeraVe, Cetaphil, Eucerin, Sarna lotion or plain Vaseline Jelly. For hands apply Neutrogena Norwegian Hand Cream or Excipial Hand Cream.  7. Reapply moisturizer any time you start to itch or feel dry.  8. Sometimes using free and clear laundry detergents can be helpful. Fabric softener sheets should be avoided. Downy Free & Gentle liquid, or any liquid fabric softener that is free of dyes and perfumes, it acceptable to use  9. If your doctor has given you prescription creams you may apply moisturizers over them      Due to recent changes in healthcare laws, you may see results of your pathology and/or laboratory studies on MyChart before the doctors have had a chance to review them. We understand that in some cases there may be results that are confusing or concerning to you. Please understand that not all results are received at the same time and often the doctors may need to interpret multiple results in order to provide you with the best plan of care or course of treatment. Therefore, we ask that you please give us 2 business days to thoroughly review all your results before contacting the office for clarification. Should we see a critical lab result, you will be contacted sooner.   If You Need Anything After Your Visit  If you have any questions or concerns for your doctor, please call our main line at [phone] and press option 4 to reach  your doctor's medical assistant. If no one answers, please leave a voicemail as directed and we will return your call as soon as possible. Messages left after 4 pm will be answered the following business day.   You may also send us a message via MyChart. We typically respond to MyChart messages within 1-2 business days.  For prescription refills, please ask your pharmacy to contact our office. Our fax number is [phone].  If you have an urgent issue when the clinic is closed that cannot wait until the next business day, you can page your doctor at the number below.    Please note that while we do our best to be available for urgent issues outside of office hours, we are not available 24/7.   If you have an urgent issue and are unable to reach us, you may choose to seek medical care at your doctor's office, retail clinic, urgent care center, or emergency room.  If you have a medical emergency, please immediately call 911 or go to the emergency department.  Pager Numbers  - Dr. Kowalski: [phone]  - Dr. Moye: [phone]  - Dr. Stewart: [phone]  In the event of inclement weather, please call our main line at [phone] for an update on the status of any delays or closures.  Dermatology Medication Tips: Please keep the boxes that topical medications come in in order to help keep track of the instructions about   where and how to use these. Pharmacies typically print the medication instructions only on the boxes and not directly on the medication tubes.   If your medication is too expensive, please contact our office at [phone] option 4 or send us a message through MyChart.   We are unable to tell what your co-pay for medications will be in advance as this is different depending on your insurance coverage. However, we may be able to find a substitute medication at lower cost or fill out paperwork to get insurance to cover a needed medication.   If a prior authorization  is required to get your medication covered by your insurance company, please allow us 1-2 business days to complete this process.  Drug prices often vary depending on where the prescription is filled and some pharmacies may offer cheaper prices.  The website www.goodrx.com contains coupons for medications through different pharmacies. The prices here do not account for what the cost may be with help from insurance (it may be cheaper with your insurance), but the website can give you the price if you did not use any insurance.  - You can print the associated coupon and take it with your prescription to the pharmacy.  - You may also stop by our office during regular business hours and pick up a GoodRx coupon card.  - If you need your prescription sent electronically to a different pharmacy, notify our office through [place] MyChart or by phone at [phone] option 4.     Si Usted Necesita Algo Despus de Su Visita  Tambin puede enviarnos un mensaje a travs de MyChart. Por lo general respondemos a los mensajes de MyChart en el transcurso de 1 a 2 das hbiles.  Para renovar recetas, por favor pida a su farmacia que se ponga en contacto con nuestra oficina. Nuestro nmero de fax es el [phone].  Si tiene un asunto urgente cuando la clnica est cerrada y que no puede esperar hasta el siguiente da hbil, puede llamar/localizar a su doctor(a) al nmero que aparece a continuacin.   Por favor, tenga en cuenta que aunque hacemos todo lo posible para estar disponibles para asuntos urgentes fuera del horario de oficina, no estamos disponibles las 24 horas del da, los 7 das de la semana.   Si tiene un problema urgente y no puede comunicarse con nosotros, puede optar por buscar atencin mdica  en el consultorio de su doctor(a), en una clnica privada, en un centro de atencin urgente o en una sala de emergencias.  Si tiene una emergencia mdica, por favor llame inmediatamente al 911 o  vaya a la sala de emergencias.  Nmeros de bper  - Dr. Kowalski: [phone]  - Dra. Moye: [phone]  - Dra. Stewart: [phone]  En caso de inclemencias del tiempo, por favor llame a nuestra lnea principal al [phone] para una actualizacin sobre el estado de cualquier retraso o cierre.  Consejos para la medicacin en dermatologa: Por favor, guarde las cajas en las que vienen los medicamentos de uso tpico para ayudarle a seguir las instrucciones sobre dnde y cmo usarlos. Las farmacias generalmente imprimen las instrucciones del medicamento slo en las cajas y no directamente en los tubos del medicamento.   Si su medicamento es muy caro, por favor, pngase en contacto con nuestra oficina llamando al [phone] y presione la opcin 4 o envenos un mensaje a travs de MyChart.   No podemos decirle cul ser su copago por los medicamentos por adelantado ya que esto   es diferente dependiendo de la cobertura de su seguro. Sin embargo, es posible que podamos encontrar un medicamento sustituto a menor costo o llenar un formulario para que el seguro cubra el medicamento que se considera necesario.   Si se requiere una autorizacin previa para que su compaa de seguros cubra su medicamento, por favor permtanos de 1 a 2 das hbiles para completar este proceso.  Los precios de los medicamentos varan con frecuencia dependiendo del lugar de dnde se surte la receta y alguna farmacias pueden ofrecer precios ms baratos.  El sitio web www.goodrx.com tiene cupones para medicamentos de diferentes farmacias. Los precios aqu no tienen en cuenta lo que podra costar con la ayuda del seguro (puede ser ms barato con su seguro), pero el sitio web puede darle el precio si no utiliz ningn seguro.  - Puede imprimir el cupn correspondiente y llevarlo con su receta a la farmacia.  - Tambin puede pasar por nuestra oficina durante el horario de atencin regular y recoger una tarjeta de cupones  de GoodRx.  - Si necesita que su receta se enve electrnicamente a una farmacia diferente, informe a nuestra oficina a travs de MyChart de [place] o por telfono llamando al [phone] y presione la opcin 4.  

## 2022-07-27 NOTE — Progress Notes (Signed)
   Follow-Up Visit   Subjective  Mario Newman is a 61 y.o. male who presents for the following: Rash (On the R neck and B/L ears x 1 mth. Very itchy and irritated, patient has tried OTC cortisone cream).  The following portions of the chart were reviewed this encounter and updated as appropriate:   Tobacco  Allergies  Meds  Problems  Med Hx  Surg Hx  Fam Hx     Review of Systems:  No other skin or systemic complaints except as noted in HPI or Assessment and Plan.  Objective  Well appearing patient in no apparent distress; mood and affect are within normal limits.  A focused examination was performed including the face, ears, and neck. Relevant physical exam findings are noted in the Assessment and Plan.  Neck, B/L ears Lichenification, hyperpigmentation, scale, and erythema.   Assessment & Plan  Rash Contact dermatitis vs atopic dermatitis -  Neck, B/L ears  Avoid wool products and scented bath/soap products.  Start Mometasone 0.1% cream BID 5d/wk. Topical steroids (such as triamcinolone, fluocinolone, fluocinonide, mometasone, clobetasol, halobetasol, betamethasone, hydrocortisone) can cause thinning and lightening of the skin if they are used for too long in the same area. Your physician has selected the right strength medicine for your problem and area affected on the body. Please use your medication only as directed by your physician to prevent side effects.    Recommend CeraVe cream daily.   mometasone (ELOCON) 0.1 % cream - Neck, B/L ears Apply to aa's rash QD-BID up to 5d/wk.  Return in about 2 months (around 09/26/2022) for rash follow up .  Luther Redo, CMA, am acting as scribe for Sarina Ser, MD . Documentation: I have reviewed the above documentation for accuracy and completeness, and I agree with the above.  Sarina Ser, MD

## 2022-07-28 ENCOUNTER — Other Ambulatory Visit: Payer: Self-pay

## 2022-07-28 DIAGNOSIS — Z1329 Encounter for screening for other suspected endocrine disorder: Secondary | ICD-10-CM

## 2022-07-28 DIAGNOSIS — Z0000 Encounter for general adult medical examination without abnormal findings: Secondary | ICD-10-CM

## 2022-08-01 ENCOUNTER — Other Ambulatory Visit: Payer: Managed Care, Other (non HMO)

## 2022-08-01 DIAGNOSIS — Z0000 Encounter for general adult medical examination without abnormal findings: Secondary | ICD-10-CM

## 2022-08-01 DIAGNOSIS — Z130 Encounter for screening for diseases of the blood and blood-forming organs and certain disorders involving the immune mechanism: Secondary | ICD-10-CM

## 2022-08-02 LAB — CBC WITH DIFFERENTIAL/PLATELET
Basophils Absolute: 0.1 10*3/uL (ref 0.0–0.2)
Basos: 1 %
EOS (ABSOLUTE): 0.2 10*3/uL (ref 0.0–0.4)
Eos: 2 %
Hematocrit: 44.6 % (ref 37.5–51.0)
Hemoglobin: 14.5 g/dL (ref 13.0–17.7)
Immature Grans (Abs): 0 10*3/uL (ref 0.0–0.1)
Immature Granulocytes: 0 %
Lymphocytes Absolute: 2.6 10*3/uL (ref 0.7–3.1)
Lymphs: 37 %
MCH: 24.2 pg — ABNORMAL LOW (ref 26.6–33.0)
MCHC: 32.5 g/dL (ref 31.5–35.7)
MCV: 74 fL — ABNORMAL LOW (ref 79–97)
Monocytes Absolute: 0.6 10*3/uL (ref 0.1–0.9)
Monocytes: 9 %
Neutrophils Absolute: 3.5 10*3/uL (ref 1.4–7.0)
Neutrophils: 51 %
Platelets: 312 10*3/uL (ref 150–450)
RBC: 6 x10E6/uL — ABNORMAL HIGH (ref 4.14–5.80)
RDW: 16.3 % — ABNORMAL HIGH (ref 11.6–15.4)
WBC: 6.9 10*3/uL (ref 3.4–10.8)

## 2022-08-02 LAB — LIPID PANEL
Chol/HDL Ratio: 2.9 ratio (ref 0.0–5.0)
Cholesterol, Total: 125 mg/dL (ref 100–199)
HDL: 43 mg/dL (ref >39)
LDL Chol Calc (NIH): 67 mg/dL (ref 0–99)
Triglycerides: 71 mg/dL (ref 0–149)
VLDL Cholesterol Cal: 15 mg/dL (ref 5–40)

## 2022-08-02 LAB — COMPREHENSIVE METABOLIC PANEL
ALT: 27 IU/L (ref 0–44)
AST: 24 IU/L (ref 0–40)
Albumin/Globulin Ratio: 1.9 (ref 1.2–2.2)
Albumin: 4.6 g/dL (ref 3.8–4.9)
Alkaline Phosphatase: 80 IU/L (ref 44–121)
BUN/Creatinine Ratio: 11 (ref 10–24)
BUN: 12 mg/dL (ref 8–27)
Bilirubin Total: 0.3 mg/dL (ref 0.0–1.2)
CO2: 22 mmol/L (ref 20–29)
Calcium: 9.5 mg/dL (ref 8.6–10.2)
Chloride: 105 mmol/L (ref 96–106)
Creatinine, Ser: 1.07 mg/dL (ref 0.76–1.27)
Globulin, Total: 2.4 g/dL (ref 1.5–4.5)
Glucose: 114 mg/dL — ABNORMAL HIGH (ref 70–99)
Potassium: 4.2 mmol/L (ref 3.5–5.2)
Sodium: 139 mmol/L (ref 134–144)
Total Protein: 7 g/dL (ref 6.0–8.5)
eGFR: 79 mL/min/{1.73_m2} (ref >59)

## 2022-08-02 LAB — TSH: TSH: 0.732 u[IU]/mL (ref 0.450–4.500)

## 2022-08-02 LAB — HEMOGLOBIN A1C
Est. average glucose Bld gHb Est-mCnc: 128 mg/dL
Hgb A1c MFr Bld: 6.1 % — ABNORMAL HIGH (ref 4.8–5.6)

## 2022-08-06 ENCOUNTER — Encounter: Payer: Self-pay | Admitting: Dermatology

## 2022-09-27 ENCOUNTER — Ambulatory Visit: Payer: Managed Care, Other (non HMO) | Admitting: Dermatology

## 2022-11-27 ENCOUNTER — Telehealth: Payer: Self-pay

## 2022-11-27 DIAGNOSIS — I10 Essential (primary) hypertension: Secondary | ICD-10-CM

## 2022-11-27 MED ORDER — LISINOPRIL 10 MG PO TABS: 10.0000 mg | ORAL_TABLET | Freq: Every day | ORAL | 0 refills | Status: DC

## 2022-11-27 NOTE — Telephone Encounter (Signed)
Refill sent.

## 2022-11-27 NOTE — Telephone Encounter (Signed)
Pt is requesting refill on: lisinopril (ZESTRIL) 10 MG tablet   [id number] [place] [address]   LOV 07/26/22 ROV 01/25/23

## 2022-12-21 ENCOUNTER — Other Ambulatory Visit: Payer: Self-pay | Admitting: Family Medicine

## 2022-12-21 DIAGNOSIS — I10 Essential (primary) hypertension: Secondary | ICD-10-CM

## 2023-01-15 ENCOUNTER — Telehealth: Payer: Self-pay

## 2023-01-15 DIAGNOSIS — I10 Essential (primary) hypertension: Secondary | ICD-10-CM

## 2023-01-15 MED ORDER — LISINOPRIL 10 MG PO TABS
10.0000 mg | ORAL_TABLET | Freq: Every day | ORAL | 0 refills | Status: DC
Start: 2023-01-15 — End: 2023-02-16

## 2023-01-15 NOTE — Telephone Encounter (Signed)
Pt is requesting a refill on: lisinopril (ZESTRIL) 10 MG tablet   Pharmacy: [id number] - [place], [place] - [address]   Pt has only 5 pills remaining but has an appt for 01/25/23

## 2023-01-15 NOTE — Telephone Encounter (Signed)
30 day sent 

## 2023-01-25 ENCOUNTER — Ambulatory Visit: Payer: Managed Care, Other (non HMO) | Admitting: Family Medicine

## 2023-01-25 ENCOUNTER — Encounter: Payer: Self-pay | Admitting: Family Medicine

## 2023-01-25 VITALS — BP 137/93 | HR 78 | Resp 18 | Ht 68.0 in | Wt 199.0 lb

## 2023-01-25 DIAGNOSIS — R413 Other amnesia: Secondary | ICD-10-CM

## 2023-01-25 DIAGNOSIS — F339 Major depressive disorder, recurrent, unspecified: Secondary | ICD-10-CM

## 2023-01-25 DIAGNOSIS — E782 Mixed hyperlipidemia: Secondary | ICD-10-CM | POA: Diagnosis not present

## 2023-01-25 DIAGNOSIS — M25552 Pain in left hip: Secondary | ICD-10-CM

## 2023-01-25 DIAGNOSIS — R7303 Prediabetes: Secondary | ICD-10-CM | POA: Diagnosis not present

## 2023-01-25 DIAGNOSIS — I10 Essential (primary) hypertension: Secondary | ICD-10-CM

## 2023-01-25 DIAGNOSIS — M25551 Pain in right hip: Secondary | ICD-10-CM

## 2023-01-25 DIAGNOSIS — L299 Pruritus, unspecified: Secondary | ICD-10-CM

## 2023-01-25 DIAGNOSIS — Z122 Encounter for screening for malignant neoplasm of respiratory organs: Secondary | ICD-10-CM

## 2023-01-25 NOTE — Patient Instructions (Addendum)
For the next 2 weeks, check your blood pressure at least twice a day and write it down in a blood pressure log.  Bring it for a blood pressure check nurse visit, and also bring your blood pressure cuff from home.  I have sent in referral to neurology for them to evaluate your current memory concerns.  Try taking an allergy medicine like Zyrtec, Claritin, or Allegra for a few weeks to see if it helps with the itching in your ears.  For your hip pain, try ice/heat, Tylenol if needed, and some gentle stretching and exercise.

## 2023-01-25 NOTE — Progress Notes (Signed)
Established Patient Office Visit  Subjective   Patient ID: Mario Newman, male    DOB: 08-04-1961  Age: 62 y.o. MRN: [id number]  Chief Complaint  Patient presents with   Anxiety   Depression   Hyperlipidemia   Hypertension    HPI Mario Newman is a 62 y.o. male presenting today for follow up of hypertension, hyperlipidemia, mood.  He is also complaining of bilateral hip itching for a couple of months now with no alleviating factors.  Additionally, he has had bilateral hip aching worse when laying down for several months.  He has not tried anything to alleviate this.  Patient has concerns for short-term memory loss, states that he is often forgetful of tasks.  No deficits in focus or long-term memory. Hypertension: Patient here for follow-up of elevated blood pressure.  Pt denies chest pain, SOB, dizziness, edema, syncope, fatigue or heart palpitations. Taking lisinopril, reports excellent compliance with treatment. Denies side effects.  He does have 1 full pot of coffee every morning and believes this may have contributed to his elevated blood pressure reading in the office today.  He does not check blood pressure at home. Hyperlipidemia: tolerating atorvastatin well with no myalgias or significant side effects.  The ASCVD Risk score (Arnett DK, et al., 2019) failed to calculate for the following reasons:   The valid total cholesterol range is 130 to 320 mg/dL Mood: Patient is here to follow up for depression, currently managing with Celexa. Taking medication without side effects, reports excellent compliance with treatment. Denies mood changes or SI/HI. He feels mood is stable since last visit. Denies chest pain, difficulty concentrating, dizziness, fatigue, insomnia, irritability, palpitations, panic attacks, racing thoughts, SOB, sweating. Denies anhedonia, depressed mood, difficulty concentrating, fatigue, feelings of worthlessness/guilt, hopelessness, hypersomnia, impaired memory,  insomnia, psychomotor agitation, psychomotor retardation, recurrent thoughts of death, weight changes.     02-23-2023    8:31 AM 07/26/2022    8:41 AM 01/23/2022    8:30 AM  Depression screen PHQ 2/9  Decreased Interest 0 0 0  Down, Depressed, Hopeless 0 0 0  PHQ - 2 Score 0 0 0  Altered sleeping 1 1 0  Tired, decreased energy Change in appetite 1 0 0  Feeling bad or failure about yourself  0  0  Trouble concentrating 2 1 0  Moving slowly or fidgety/restless 0 0 0  Suicidal thoughts 0 0 0  PHQ-9 Score Difficult doing work/chores Not difficult at all Not difficult at all Not difficult at all       23-Feb-2023    8:32 AM 07/26/2022    8:41 AM 01/23/2022    8:30 AM 07/25/2021    9:00 AM  GAD 7 : Generalized Anxiety Score  Nervous, Anxious, on Edge 0 0 0 0  Control/stop worrying 0 0 0 0  Worry too much - different things 1 0 1 0  Trouble relaxing 1 1 0 0  Restless 0 0 0 0  Easily annoyed or irritable 0 0 1 1  Afraid - awful might happen 0 0 0 0  Total GAD 7 Score Anxiety Difficulty Not difficult at all Not difficult at all Not difficult at all Not difficult at all   ROS Negative unless otherwise noted in HPI   Objective:     BP (!) 137/93 (BP Location: Left Arm, Patient Position: Sitting, Cuff Size: Normal)   Pulse 78   Resp 18  Ht  (1.727 m)   Wt 199 lb (90.3 kg)   SpO2 96%   BMI 30.26 kg/m   Physical Exam Constitutional:      General: He is not in acute distress.    Appearance: Normal appearance.  HENT:     Head: Normocephalic and atraumatic.     Right Ear: Tympanic membrane, ear canal and external ear normal.     Left Ear: Tympanic membrane, ear canal and external ear normal.  Cardiovascular:     Rate and Rhythm: Normal rate and regular rhythm.     Heart sounds: Normal heart sounds. No murmur heard.    No friction rub. No gallop.  Pulmonary:     Effort: Pulmonary effort is normal.     Breath sounds: Normal breath sounds. No  wheezing, rhonchi or rales.  Musculoskeletal:        General: Normal range of motion.  Skin:    General: Skin is warm and dry.  Neurological:     Mental Status: He is alert and oriented to person, place, and time.  Psychiatric:        Mood and Affect: Mood normal.     Assessment & Plan:  Essential hypertension Assessment & Plan: Elevated initially 142/88, on repeat 137/93.  Patient reports drinking a full pot of coffee already this morning and believes this may be contributing to the elevated blood pressure.  We discussed goal blood pressure of less than 130/80.  Patient will keep a blood pressure log checking at least twice a day for the next 2 weeks and return for nurse visit.  Continue lisinopril 10 mg daily.  Repeating CMP for electrolytes and renal function today.  Will continue to monitor.  Orders: -     CBC with Differential/Platelet; Future -     Comprehensive metabolic panel; Future  Mixed hyperlipidemia Assessment & Plan: Last lipid panel: LDL 67, HDL 43, triglycerides 71.  Continue atorvastatin 40 mg daily.  Recommend heart healthy diet low in fat and regular exercise.  Repeating lipid panel and CMP for hepatic function today.  Will continue to monitor.  Orders: -     Comprehensive metabolic panel; Future -     Lipid panel; Future  Depression, recurrent Assessment & Plan: PHQ-9 score of 5, essentially at baseline.  Stable.  Continue Celexa 40 mg daily.  Will continue to monitor.  Orders: -     CBC with Differential/Platelet; Future -     Comprehensive metabolic panel; Future  Prediabetes Assessment & Plan: Last A1c 6.1, repeating A1c today.  Recommend to be mindful of and limit intake of sugary foods and simple carbohydrates.  Will continue to monitor.  Orders: -     Hemoglobin A1c; Future  Poor short term memory -     Ambulatory referral to Neurology  Screening for lung cancer -     CT CHEST LUNG CANCER SCREENING LOW DOSE WO CONTRAST; Future  Ear  itching  Bilateral hip pain  Bilateral ear itching Ear exam unremarkable.  Recommended trial of allergy medication like Zyrtec, Claritin, or Allegra.  Patient is planning on going to the [place] for hearing evaluation and would like to have them take a look rather than going to ENT.  Bilateral hip pain Recommended conservative measures for bilateral hip pain.  Try icing, Tylenol if needed, and gentle strength exercises and stretching.  Will continue to monitor.  Return in about 6 months (around 07/27/2023) for annual physical, fasting blood work 1 week before.  Velva Harman, PA

## 2023-01-25 NOTE — Assessment & Plan Note (Signed)
Last A1c 6.1, repeating A1c today.  Recommend to be mindful of and limit intake of sugary foods and simple carbohydrates.  Will continue to monitor.

## 2023-01-25 NOTE — Assessment & Plan Note (Signed)
Last lipid panel: LDL 67, HDL 43, triglycerides 71.  Continue atorvastatin 40 mg daily.  Recommend heart healthy diet low in fat and regular exercise.  Repeating lipid panel and CMP for hepatic function today.  Will continue to monitor.

## 2023-01-25 NOTE — Assessment & Plan Note (Addendum)
Elevated initially 142/88, on repeat 137/93.  Patient reports drinking a full pot of coffee already this morning and believes this may be contributing to the elevated blood pressure.  We discussed goal blood pressure of less than 130/80.  Patient will keep a blood pressure log checking at least twice a day for the next 2 weeks and return for nurse visit.  Continue lisinopril 10 mg daily.  Repeating CMP for electrolytes and renal function today.  Will continue to monitor.

## 2023-01-25 NOTE — Assessment & Plan Note (Addendum)
PHQ-9 score of 5, essentially at baseline.  Stable.  Continue Celexa 40 mg daily.  Will continue to monitor.

## 2023-01-26 ENCOUNTER — Telehealth: Payer: Self-pay

## 2023-01-26 LAB — CBC WITH DIFFERENTIAL/PLATELET
Basophils Absolute: 0.1 10*3/uL (ref 0.0–0.2)
Basos: 1 %
EOS (ABSOLUTE): 0.2 10*3/uL (ref 0.0–0.4)
Eos: 3 %
Hematocrit: 43.9 % (ref 37.5–51.0)
Hemoglobin: 13.8 g/dL (ref 13.0–17.7)
Immature Grans (Abs): 0.1 10*3/uL (ref 0.0–0.1)
Immature Granulocytes: 1 %
Lymphocytes Absolute: 2.3 10*3/uL (ref 0.7–3.1)
Lymphs: 36 %
MCH: 23.5 pg — ABNORMAL LOW (ref 26.6–33.0)
MCHC: 31.4 g/dL — ABNORMAL LOW (ref 31.5–35.7)
MCV: 75 fL — ABNORMAL LOW (ref 79–97)
Monocytes Absolute: 0.7 10*3/uL (ref 0.1–0.9)
Monocytes: 11 %
Neutrophils Absolute: 3.1 10*3/uL (ref 1.4–7.0)
Neutrophils: 48 %
Platelets: 276 10*3/uL (ref 150–450)
RBC: 5.86 x10E6/uL — ABNORMAL HIGH (ref 4.14–5.80)
RDW: 15.6 % — ABNORMAL HIGH (ref 11.6–15.4)
WBC: 6.3 10*3/uL (ref 3.4–10.8)

## 2023-01-26 LAB — LIPID PANEL
Chol/HDL Ratio: 2.9 ratio (ref 0.0–5.0)
Cholesterol, Total: 121 mg/dL (ref 100–199)
HDL: 42 mg/dL (ref >39)
LDL Chol Calc (NIH): 64 mg/dL (ref 0–99)
Triglycerides: 70 mg/dL (ref 0–149)
VLDL Cholesterol Cal: 15 mg/dL (ref 5–40)

## 2023-01-26 LAB — COMPREHENSIVE METABOLIC PANEL
ALT: 24 IU/L (ref 0–44)
AST: 23 IU/L (ref 0–40)
Albumin/Globulin Ratio: 1.8 (ref 1.2–2.2)
Albumin: 4.4 g/dL (ref 3.9–4.9)
Alkaline Phosphatase: 94 IU/L (ref 44–121)
BUN/Creatinine Ratio: 13 (ref 10–24)
BUN: 13 mg/dL (ref 8–27)
Bilirubin Total: 0.3 mg/dL (ref 0.0–1.2)
CO2: 20 mmol/L (ref 20–29)
Calcium: 9.2 mg/dL (ref 8.6–10.2)
Chloride: 104 mmol/L (ref 96–106)
Creatinine, Ser: 0.98 mg/dL (ref 0.76–1.27)
Globulin, Total: 2.4 g/dL (ref 1.5–4.5)
Glucose: 94 mg/dL (ref 70–99)
Potassium: 4.5 mmol/L (ref 3.5–5.2)
Sodium: 140 mmol/L (ref 134–144)
Total Protein: 6.8 g/dL (ref 6.0–8.5)
eGFR: 88 mL/min/{1.73_m2} (ref >59)

## 2023-01-26 LAB — HEMOGLOBIN A1C
Est. average glucose Bld gHb Est-mCnc: 137 mg/dL
Hgb A1c MFr Bld: 6.4 % — ABNORMAL HIGH (ref 4.8–5.6)

## 2023-01-26 NOTE — Telephone Encounter (Signed)
Pt came by to drop of this [employer] Letter paperwork to be completed.   I placed his paperwork in [place] box.  Please advise and Call pt once complete.

## 2023-01-29 ENCOUNTER — Ambulatory Visit (INDEPENDENT_AMBULATORY_CARE_PROVIDER_SITE_OTHER): Payer: Managed Care, Other (non HMO) | Admitting: Family Medicine

## 2023-01-29 ENCOUNTER — Encounter: Payer: Self-pay | Admitting: Family Medicine

## 2023-01-29 VITALS — BP 131/76 | HR 68

## 2023-01-29 DIAGNOSIS — I10 Essential (primary) hypertension: Secondary | ICD-10-CM

## 2023-01-29 NOTE — Telephone Encounter (Signed)
Please call the patient and let him know that I do not have enough information to write this letter for him.  The last time we spoke, he was planning on going to the [place] to be evaluated for his hearing but I have not seen anything about the results of that.  I am going to need him to schedule an appointment with me so that I can get all the information from him that I need including copies of any hearing evaluation and I says that was done at the [place].

## 2023-01-29 NOTE — Progress Notes (Signed)
Pt denies CP, SOB, dizziness, or heart palpitations. taking meds as directed without problems. Denies med side effects. 5 min spent with pt. 

## 2023-01-31 ENCOUNTER — Ambulatory Visit: Payer: Managed Care, Other (non HMO) | Admitting: Family Medicine

## 2023-01-31 ENCOUNTER — Encounter: Payer: Self-pay | Admitting: Family Medicine

## 2023-01-31 VITALS — BP 126/87 | HR 76 | Resp 18 | Ht 68.0 in | Wt 201.0 lb

## 2023-01-31 DIAGNOSIS — H9313 Tinnitus, bilateral: Secondary | ICD-10-CM | POA: Diagnosis not present

## 2023-01-31 DIAGNOSIS — R41840 Attention and concentration deficit: Secondary | ICD-10-CM

## 2023-01-31 DIAGNOSIS — F419 Anxiety disorder, unspecified: Secondary | ICD-10-CM

## 2023-01-31 DIAGNOSIS — R413 Other amnesia: Secondary | ICD-10-CM

## 2023-01-31 DIAGNOSIS — H9193 Unspecified hearing loss, bilateral: Secondary | ICD-10-CM | POA: Diagnosis not present

## 2023-01-31 DIAGNOSIS — G4700 Insomnia, unspecified: Secondary | ICD-10-CM

## 2023-01-31 DIAGNOSIS — F515 Nightmare disorder: Secondary | ICD-10-CM

## 2023-01-31 NOTE — Progress Notes (Signed)
Established Patient Office Visit  Subjective   Patient ID: Mario Newman, male    DOB: 1961/05/31  Age: 62 y.o. MRN: [id number]  Chief Complaint  Patient presents with   Hearing Problem   Insomnia         HPI Mario Newman is a 62 y.o. male presenting today for follow up of hearing loss and insomnia.  Patient was initially evaluated by audiology for hearing loss in 2019 and found to have bilateral hearing loss and tinnitus.  It was recommended at that time to follow-up with ENT to be fitted for a hearing aid, but he never did get hearing aids.  The hearing loss and tinnitus have worsened since that time.  He also has concerns about changes in memory, changes in concentration, insomnia, nightmares, and racing thoughts.  He has started experiencing these things within the last several months which is concerning as it is different from his baseline.  His work involves doing [employer] which requires memory and concentration skills that previously have not been an issue but is now noticeable.  Likewise, he used to sleep well but now finds that it is difficult for him to sleep and when he does sleep he often has nightmares.  He also finds that throughout the day he will have racing thoughts and restless mind often jumping from worry to worry about everything from world events to retirement to his family.  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  While in the office, he recalled feeling like a "[place] pig" while he was in the [employer], considering the entire time to be traumatic rather than distinctive events for multiple reasons.  ROS Negative unless otherwise noted in HPI   Objective:     BP 126/87 (BP Location: Left  Arm, Patient Position: Sitting, Cuff Size: Normal)   Pulse 76   Resp 18   Ht  (1.727 m)   Wt 201 lb (91.2 kg)   SpO2 95%   BMI 30.56 kg/m   Physical Exam Vitals reviewed.  Constitutional:      General: He is not in acute distress.    Appearance: Normal appearance. He is not ill-appearing.  HENT:     Head: Normocephalic and atraumatic.     Nose: Nose normal.  Eyes:     Conjunctiva/sclera: Conjunctivae normal.  Pulmonary:     Effort: Pulmonary effort is normal.  Musculoskeletal:     Cervical back: Normal range of motion.  Skin:    Coloration: Skin is not jaundiced or pale.     Findings: No bruising.  Neurological:     Mental Status: He is alert and oriented to person, place, and time. Mental status is at baseline.  Psychiatric:        Attention and Perception: Attention and perception normal.        Mood and Affect: Mood and affect normal. Mood is not anxious or depressed.        Speech: Speech normal.        Behavior: Behavior normal.        Thought Content: Thought content normal.        Cognition and Memory: Cognition normal.  Judgment: Judgment normal.     Assessment & Plan:  Tinnitus of both ears  Bilateral hearing loss, unspecified hearing loss type  Memory changes  Difficulty concentrating  Insomnia, unspecified type  Nightmares  Anxiety  It has been quite sometime since being evaluated by audiology for hearing loss and tinnitus.  [facility] before [facility] him with [facility], but ultimately hearing aids would be most beneficial for this condition.  Robin does experience racing thoughts and many worries.  His symptoms could be consistent with either GAD or PTSD, particularly with some of his most recent thoughts ruminating on his experiences while in the [employer].  Based on our discussion, my understanding is that he is going to be screened for any need for counseling at the [place] once  they receive the [employer] letter that I wrote for him.  I think that this would be beneficial either alone or in conjunction with involvement from psychiatry.  [facility] and if needed we can coordinate with other offices and specialties.  Return if symptoms worsen or fail to improve.   I spent 35 minutes on the day of the encounter to include pre-visit record review, face-to-face time with the patient, and post-visit documentation for [employer] letter.  Melida Quitter, PA

## 2023-02-01 ENCOUNTER — Ambulatory Visit
Admission: RE | Admit: 2023-02-01 | Discharge: 2023-02-01 | Disposition: A | Discharge status: Home / Self Care | Payer: Managed Care, Other (non HMO) | Source: Ambulatory Visit | Attending: Family Medicine | Admitting: Family Medicine

## 2023-02-01 DIAGNOSIS — Z122 Encounter for screening for malignant neoplasm of respiratory organs: Secondary | ICD-10-CM

## 2023-02-08 ENCOUNTER — Ambulatory Visit (INDEPENDENT_AMBULATORY_CARE_PROVIDER_SITE_OTHER): Payer: Managed Care, Other (non HMO) | Admitting: Family Medicine

## 2023-02-08 VITALS — BP 123/81 | HR 82 | Ht 68.0 in | Wt 201.0 lb

## 2023-02-08 DIAGNOSIS — I10 Essential (primary) hypertension: Secondary | ICD-10-CM

## 2023-02-08 NOTE — Progress Notes (Signed)
Blood pressure at goal today, patient was encouraged to change the batteries on his at home blood pressure cuff to ensure he is getting the most accurate readings there.

## 2023-02-08 NOTE — Progress Notes (Signed)
Pt was here for a recheck of his blood pressure

## 2023-02-16 ENCOUNTER — Other Ambulatory Visit: Payer: Self-pay | Admitting: Family Medicine

## 2023-02-16 DIAGNOSIS — I10 Essential (primary) hypertension: Secondary | ICD-10-CM

## 2023-02-21 ENCOUNTER — Telehealth: Payer: Self-pay

## 2023-02-21 DIAGNOSIS — E7800 Pure hypercholesterolemia, unspecified: Secondary | ICD-10-CM

## 2023-02-21 DIAGNOSIS — I10 Essential (primary) hypertension: Secondary | ICD-10-CM

## 2023-02-21 MED ORDER — ATORVASTATIN CALCIUM 40 MG PO TABS
ORAL_TABLET | ORAL | 1 refills | Status: DC
Start: 2023-02-21 — End: 2023-05-21

## 2023-02-21 MED ORDER — LISINOPRIL 10 MG PO TABS
10.0000 mg | ORAL_TABLET | Freq: Every day | ORAL | 1 refills | Status: DC
Start: 2023-02-21 — End: 2023-06-19

## 2023-02-21 NOTE — Telephone Encounter (Signed)
Refill sent.

## 2023-02-21 NOTE — Telephone Encounter (Signed)
Pt is requesting a rx on  atorvastatin (LIPITOR) 40 MG tablet   Pharmacy: [id number] - [place], [place] - [address]

## 2023-02-21 NOTE — Telephone Encounter (Signed)
Has been corrected.

## 2023-02-21 NOTE — Telephone Encounter (Signed)
Wrong med was sent in please send in the atorvastatin (LIPITOR) 40 MG tablet

## 2023-02-21 NOTE — Addendum Note (Signed)
Addended by: Tonny Bollman on: 02/21/2023 01:22 PM   Modules accepted: Orders

## 2023-03-20 ENCOUNTER — Other Ambulatory Visit: Payer: Self-pay | Admitting: Family Medicine

## 2023-03-20 DIAGNOSIS — F339 Major depressive disorder, recurrent, unspecified: Secondary | ICD-10-CM

## 2023-03-29 ENCOUNTER — Encounter: Payer: Self-pay | Admitting: Neurology

## 2023-03-29 ENCOUNTER — Ambulatory Visit: Payer: Managed Care, Other (non HMO) | Admitting: Neurology

## 2023-03-29 ENCOUNTER — Telehealth: Payer: Self-pay | Admitting: Neurology

## 2023-03-29 VITALS — BP 122/70 | HR 71 | Ht 68.0 in | Wt 202.0 lb

## 2023-03-29 DIAGNOSIS — R4189 Other symptoms and signs involving cognitive functions and awareness: Secondary | ICD-10-CM

## 2023-03-29 NOTE — Progress Notes (Signed)
[facility]  PATIENT: Mario Newman DOB: Nov 19, 1960  REQUESTING CLINICIAN: Melida Quitter, PA HISTORY FROM: Patient  REASON FOR VISIT: Memory loss    HISTORICAL  CHIEF COMPLAINT:  Chief Complaint  Patient presents with   New Patient (Initial Visit)    Rm 13, with wife Sarah, NP memory, 8 months ago short term memory issues began, MoCa 27, doesn't sleep well due to nasal stuffiness, ears will drain fluid. Previous concussion as high school football player, also served in [employer] Beginning to forget things he thought he'd never forget, long term significant things    HISTORY OF PRESENT ILLNESS:  This is a 62 year old gentleman past medical history of hypertension, hyperlipidemia, anxiety who is presenting with concern of memory loss for the past 8 months.  Patient reports for the past 8 months he has been having worsening memory, stated most of the time, he will go to a room and forget the reason he was there in the first place.  He is unable to keep focused on more than one task. It is difficult for him to multitask.  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  He is not remembering events like before.  Wife is concerned about ADD because both son have ADD and they exhibit the same behaviors sometimes like her husband.  He is independent all actives of daily living.  Wife reports that he is not repetitive and does not asked the same questions over and over.    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   Mood: Anxiety and depression Family history of Dementia: Denies  Functional status: independent in all ADLs and IADLs Patient lives with spouse and family . Cooking: no Cleaning: yes  Shopping: no Bathing: no issues  Toileting: no issues  Driving: no issues  Bills: wife  Medications: no  issues Ever left the stove on by accident?: n/a Forget how to use items around the house?: denies  Getting lost going to familiar places?: denies  Forgetting loved ones names?: denies Word finding difficulty? Yes  Sleep: not good, does not sleep    OTHER MEDICAL CONDITIONS: Anxiety, Hypertension, Hyperlipidemia    REVIEW OF SYSTEMS: Full 14 system review of systems performed and negative with exception of: As noted in the HPI  ALLERGIES: No Known Allergies  HOME MEDICATIONS: Outpatient Medications Prior to Visit  Medication Sig Dispense Refill   atorvastatin (LIPITOR) 40 MG tablet TAKE 1 TABLET BY MOUTH EVERY DAY AT 6 PM 90 tablet 1   citalopram (CELEXA) 40 MG tablet TAKE 1 TABLET BY MOUTH EVERY DAY 90 tablet 1   lisinopril (ZESTRIL) 10 MG tablet Take 1 tablet (10 mg total) by mouth daily. 90 tablet 1   mometasone (ELOCON) 0.1 % cream Apply to aa's rash QD-BID up to 5d/wk. 45 g 2   omeprazole (PRILOSEC) 20 MG capsule TAKE 1 CAPSULE BY MOUTH EVERY DAY 90 capsule 0   tadalafil (CIALIS) 5 MG tablet Take 1 tablet (5 mg total) by mouth daily as needed for erectile dysfunction. 30 tablet 0   nicotine (NICODERM CQ) 21 mg/24hr patch Place 1 patch (21 mg total) onto the skin daily. (Patient not taking: Reported on 03/29/2023) 28 patch 0   No facility-administered medications prior to visit.    PAST MEDICAL HISTORY: Past Medical History:  Diagnosis Date   Hyperlipidemia    Hypertension     PAST SURGICAL HISTORY: History reviewed. No pertinent surgical history.  FAMILY HISTORY: Family History  Problem Relation Age of Onset   Hypertension Mother    Diabetes Father    Alcohol abuse Father    Healthy Sister     SOCIAL HISTORY: Social History   Socioeconomic History   Marital status: Married    Spouse name: Not on file   Number of children: Not on file   Years of education: Not on file   Highest education level: Not on file  Occupational History   Not on file  Tobacco Use    Smoking status: Every Day    Packs/day: 1.00    Years: 20.00    Additional pack years: 0.00    Total pack years: 20.00    Types: Cigarettes    Passive exposure: Never   Smokeless tobacco: Never  Vaping Use   Vaping Use: Every day  Substance and Sexual Activity   Alcohol use: No   Drug use: No   Sexual activity: Yes    Birth control/protection: None  Other Topics Concern   Not on file  Social History Narrative   Right handed   Caffeine - 1 pot of coffee   Lives at home with Wife and adult children   Social Determinants of Health   Financial Resource Strain: Not on file  Food Insecurity: Not on file  Transportation Needs: Not on file  Physical Activity: Not on file  Stress: Not on file  Social Connections: Not on file  Intimate Partner Violence: Not on file    PHYSICAL EXAM  GENERAL EXAM/CONSTITUTIONAL: Vitals:  Vitals:   03/29/23 0938  BP: 122/70  Pulse: 71  SpO2: 95%  Weight: 202 lb (91.6 kg)  Height: 5\' 8"  (1.727 m)   Body mass index is 30.71 kg/m. Wt Readings from Last 3 Encounters:  03/29/23 202 lb (91.6 kg)  02/08/23 201 lb (91.2 kg)  01/31/23 201 lb (91.2 kg)   Patient is in no distress; well developed, nourished and groomed; neck is supple  MUSCULOSKELETAL: Gait, strength, tone, movements noted in Neurologic exam below  NEUROLOGIC: MENTAL STATUS:      No data to display            03/29/2023    9:43 AM  Montreal Cognitive Assessment   Visuospatial/ Executive (0/5) 5  Naming (0/3) 3  Attention: Read list of digits (0/2) 2  Attention: Read list of letters (0/1) 1  Attention: Serial 7 subtraction starting at 100 (0/3) 3  Language: Repeat phrase (0/2) 1  Language : Fluency (0/1) 1  Abstraction (0/2) 2  Delayed Recall (0/5) 3  Orientation (0/6) 6  Total 27     CRANIAL NERVE:  2nd, 3rd, 4th, 6th- visual fields full to confrontation, extraocular muscles intact, no nystagmus 5th - facial sensation symmetric 7th - facial strength  symmetric 8th - hearing intact 9th - palate elevates symmetrically, uvula midline 11th - shoulder shrug symmetric 12th - tongue protrusion midline  MOTOR:  normal bulk and tone, full strength in the BUE, BLE  SENSORY:  normal and symmetric to light touch  COORDINATION:  finger-nose-finger, fine finger movements normal  GAIT/STATION:  normal   DIAGNOSTIC DATA (LABS, IMAGING, TESTING) - I reviewed patient records, labs, notes, testing and imaging myself where available.  Lab Results  Component Value Date   WBC 6.3 01/25/2023   HGB 13.8 01/25/2023   HCT 43.9  01/25/2023   MCV 75 (L) 01/25/2023   PLT 276 01/25/2023      Component Value Date/Time   NA 140 01/25/2023 0901   K 4.5 01/25/2023 0901   CL 104 01/25/2023 0901   CO2 20 01/25/2023 0901   GLUCOSE 94 01/25/2023 0901   BUN 13 01/25/2023 0901   CREATININE 0.98 01/25/2023 0901   CALCIUM 9.2 01/25/2023 0901   PROT 6.8 01/25/2023 0901   ALBUMIN 4.4 01/25/2023 0901   AST 23 01/25/2023 0901   ALT 24 01/25/2023 0901   ALKPHOS 94 01/25/2023 0901   BILITOT 0.3 01/25/2023 0901   GFRNONAA 77 01/19/2020 0944   GFRAA 89 01/19/2020 0944   Lab Results  Component Value Date   CHOL 121 01/25/2023   HDL 42 01/25/2023   LDLCALC 64 01/25/2023   LDLDIRECT 56 07/28/2019   TRIG 70 01/25/2023   CHOLHDL 2.9 01/25/2023   Lab Results  Component Value Date   HGBA1C 6.4 (H) 01/25/2023   Lab Results  Component Value Date   VITAMINB12 527 02/21/2022   Lab Results  Component Value Date   TSH 0.732 08/01/2022      ASSESSMENT AND PLAN  62 y.o. year old male with hypertension, hyperlipidemia, anxiety who is presenting with complaint of memory loss, difficulty concentrating for the past 42-months, he is independent in all activities of daily living.  His neurological exam today was normal including a normal MoCA.  I have informed patient that even though he does have subjective memory complaint, he has normal cognition, probably  there is some underlying undiagnosed ADD which is a possibility.  For now I am going to refer him for a formal neuropsychological testing to get a baseline, informed patient that this test will happen likely next year and he is comfortable with plans.  I will also obtain a brain MRI as part of the workup of memory loss.  His recent labs included normal B12 and TSH.  Also discussed ways to reduce the risk of developing dementia including smoking cessation and exercise.  He voices understanding, I will see him in 1 year for follow-up or sooner if worse.   1. Subjective memory complaints      Patient Instructions  Continue current medications MRI brain without contrast Referral for formal neuropsychological testing We also discussed ways to reduce the risk of developing dementia including smoking cessation and exercise Follow-up in 1 year or sooner if worse   There are well-accepted and sensible ways to reduce risk for Alzheimers disease and other degenerative brain disorders .  Exercise Daily Walk A daily 20 minute walk should be part of your routine. Disease related apathy can be a significant roadblock to exercise and the only way to overcome this is to make it a daily routine and perhaps have a reward at the end (something your loved one loves to eat or drink perhaps) or a personal trainer coming to the home can also be very useful. Most importantly, the patient is much more likely to exercise if the caregiver / spouse does it with him/her. In general a structured, repetitive schedule is best.  General Health: Any diseases which effect your body will effect your brain such as a pneumonia, urinary infection, blood clot, heart attack or stroke. Keep contact with your primary care doctor for regular follow ups.  Sleep. A good nights sleep is healthy for the brain. Seven hours is recommended. If you have insomnia or poor sleep habits we can give you some instructions.  If you have sleep apnea wear  your mask.  Diet: Eating a heart healthy diet is also a good idea; fish and poultry instead of red meat, nuts (mostly non-peanuts), vegetables, fruits, olive oil or canola oil (instead of butter), minimal salt (use other spices to flavor foods), whole grain rice, bread, cereal and pasta and wine in moderation.Research is now showing that the MIND diet, which is a combination of The Mediterranean diet and the DASH diet, is beneficial for cognitive processing and longevity. Information about this diet can be found in The MIND Diet, a book by Alonna Minium, MS, RDN, and online at [employer].es  Finances, Power of [address] and Advance Directives: You should consider putting legal safeguards in place with regard to financial and medical decision making. While the spouse always has power of attorney for medical and financial issues in the absence of any form, you should consider what you want in case the spouse / caregiver is no longer around or capable of making decisions.   Orders Placed This Encounter  Procedures   MR BRAIN WO CONTRAST    No orders of the defined types were placed in this encounter.   Return in about 1 year (around 03/28/2024).  I have spent a total of 60 minutes dedicated to this patient today, preparing to see patient, performing a medically appropriate examination and evaluation, ordering tests and/or medications and procedures, and counseling and educating the patient/family/caregiver; independently interpreting result and communicating results to the family/patient/caregiver; and documenting clinical information in the electronic medical record.   Windell Norfolk, MD 03/30/2023, 9:27 AM  [facility] [address] [place], [place] [address] [phone]

## 2023-03-29 NOTE — Telephone Encounter (Signed)
sent to GI they obtain [employer] auth [phone] 

## 2023-03-30 ENCOUNTER — Encounter: Payer: Self-pay | Admitting: Neurology

## 2023-03-30 NOTE — Patient Instructions (Signed)
Continue current medications MRI brain without contrast Referral for formal neuropsychological testing We also discussed ways to reduce the risk of developing dementia including smoking cessation and exercise Follow-up in 1 year or sooner if worse   There are well-accepted and sensible ways to reduce risk for Alzheimers disease and other degenerative brain disorders .  Exercise Daily Walk A daily 20 minute walk should be part of your routine. Disease related apathy can be a significant roadblock to exercise and the only way to overcome this is to make it a daily routine and perhaps have a reward at the end (something your loved one loves to eat or drink perhaps) or a personal trainer coming to the home can also be very useful. Most importantly, the patient is much more likely to exercise if the caregiver / spouse does it with him/her. In general a structured, repetitive schedule is best.  General Health: Any diseases which effect your body will effect your brain such as a pneumonia, urinary infection, blood clot, heart attack or stroke. Keep contact with your primary care doctor for regular follow ups.  Sleep. A good nights sleep is healthy for the brain. Seven hours is recommended. If you have insomnia or poor sleep habits we can give you some instructions. If you have sleep apnea wear your mask.  Diet: Eating a heart healthy diet is also a good idea; fish and poultry instead of red meat, nuts (mostly non-peanuts), vegetables, fruits, olive oil or canola oil (instead of butter), minimal salt (use other spices to flavor foods), whole grain rice, bread, cereal and pasta and wine in moderation.Research is now showing that the MIND diet, which is a combination of The Mediterranean diet and the DASH diet, is beneficial for cognitive processing and longevity. Information about this diet can be found in The MIND Diet, a book by Alonna Minium, MS, RDN, and online at  [employer]  Finances, Power of [address] and Advance Directives: You should consider putting legal safeguards in place with regard to financial and medical decision making. While the spouse always has power of attorney for medical and financial issues in the absence of any form, you should consider what you want in case the spouse / caregiver is no longer around or capable of making decisions.

## 2023-04-04 ENCOUNTER — Ambulatory Visit
Admission: RE | Admit: 2023-04-04 | Discharge: 2023-04-04 | Disposition: A | Discharge status: Home / Self Care | Payer: Managed Care, Other (non HMO) | Source: Ambulatory Visit | Attending: Neurology | Admitting: Neurology

## 2023-04-04 DIAGNOSIS — R4189 Other symptoms and signs involving cognitive functions and awareness: Secondary | ICD-10-CM

## 2023-04-06 NOTE — Progress Notes (Signed)
Kindly inform the patient that MRI scan study of the brain is normal

## 2023-04-09 ENCOUNTER — Telehealth: Payer: Self-pay

## 2023-04-09 NOTE — Telephone Encounter (Signed)
-----   Message from Micki Riley, MD sent at 04/06/2023  9:21 AM EDT ----- Joneen Roach inform the patient that MRI scan study of the brain is normal

## 2023-04-09 NOTE — Telephone Encounter (Signed)
I spoke with the patient and provided results of the MRI. He is requesting clarification on "Few small periventricular subcortical foci of nonspecific T2 hyperintensities."

## 2023-04-15 NOTE — Telephone Encounter (Signed)
Please inform patient these findings are nonspecific and can be related to age, history of high blood pressure, history of smoking, but again very nonspecific. MRI did not show any acute findings such as stroke, tumor or blood product.

## 2023-04-17 NOTE — Telephone Encounter (Signed)
I spoke with the patient and relayed the message from Dr. Teresa Coombs. He verbalized understanding of the message and expressed appreciation for the call. He denied any further questions at this time.

## 2023-05-21 ENCOUNTER — Other Ambulatory Visit: Payer: Self-pay | Admitting: Family Medicine

## 2023-05-21 DIAGNOSIS — E7800 Pure hypercholesterolemia, unspecified: Secondary | ICD-10-CM

## 2023-06-19 ENCOUNTER — Other Ambulatory Visit: Payer: Self-pay | Admitting: Family Medicine

## 2023-06-19 DIAGNOSIS — F339 Major depressive disorder, recurrent, unspecified: Secondary | ICD-10-CM

## 2023-06-19 DIAGNOSIS — I10 Essential (primary) hypertension: Secondary | ICD-10-CM

## 2023-07-20 ENCOUNTER — Other Ambulatory Visit: Payer: Managed Care, Other (non HMO)

## 2023-07-27 ENCOUNTER — Encounter: Payer: Self-pay | Admitting: Family Medicine

## 2023-07-27 ENCOUNTER — Ambulatory Visit (INDEPENDENT_AMBULATORY_CARE_PROVIDER_SITE_OTHER): Payer: Managed Care, Other (non HMO) | Admitting: Family Medicine

## 2023-07-27 VITALS — BP 146/94 | HR 71 | Resp 18 | Ht 68.0 in | Wt 206.0 lb

## 2023-07-27 DIAGNOSIS — K2100 Gastro-esophageal reflux disease with esophagitis, without bleeding: Secondary | ICD-10-CM

## 2023-07-27 DIAGNOSIS — Z0000 Encounter for general adult medical examination without abnormal findings: Secondary | ICD-10-CM | POA: Diagnosis not present

## 2023-07-27 DIAGNOSIS — R0609 Other forms of dyspnea: Secondary | ICD-10-CM

## 2023-07-27 DIAGNOSIS — I10 Essential (primary) hypertension: Secondary | ICD-10-CM

## 2023-07-27 DIAGNOSIS — R7303 Prediabetes: Secondary | ICD-10-CM | POA: Diagnosis not present

## 2023-07-27 DIAGNOSIS — E782 Mixed hyperlipidemia: Secondary | ICD-10-CM | POA: Diagnosis not present

## 2023-07-27 DIAGNOSIS — F339 Major depressive disorder, recurrent, unspecified: Secondary | ICD-10-CM

## 2023-07-27 DIAGNOSIS — Z720 Tobacco use: Secondary | ICD-10-CM

## 2023-07-27 MED ORDER — OMEPRAZOLE 20 MG PO CPDR
20.0000 mg | DELAYED_RELEASE_CAPSULE | Freq: Every day | ORAL | 1 refills | Status: AC
Start: 2023-07-27 — End: ?

## 2023-07-27 MED ORDER — LISINOPRIL 20 MG PO TABS
20.0000 mg | ORAL_TABLET | Freq: Every day | ORAL | 3 refills | Status: AC
Start: 2023-07-27 — End: ?

## 2023-07-27 NOTE — Assessment & Plan Note (Signed)
Last lipid panel: LDL 64, HDL 42, triglycerides 70.  Continue atorvastatin 40 mg daily.  Recommend heart healthy diet low in fat and regular exercise.  Repeating lipid panel and CMP for hepatic function.  Will continue to monitor.

## 2023-07-27 NOTE — Assessment & Plan Note (Signed)
Last A1c 6.4.  Recommend to be mindful of and limit intake of sugary foods and simple carbohydrates.  Will continue to monitor.

## 2023-07-27 NOTE — Progress Notes (Signed)
Complete physical exam  Patient: Mario Newman   DOB: 09/29/1961   62 y.o. Male  MRN: [id number]  Subjective:    Chief Complaint  Patient presents with   Annual Exam    Mario Newman is a 62 y.o. male who presents today for a complete physical exam. He reports consuming a low sodium diet.  He generally feels fairly well. He reports sleeping poorly due to chronic left shoulder pain.  He is interested in having an [employer] PBM prescribed, which he found out about at a [place] event from one of the vendors.  He is under informed him that if his primary care ordered it, [facility] would [facility].  He also complains of feeling like fluid is leaking out of his ears every night, denies ever seeing liquid on his pillowcase.  He has also noticed that he becomes short of breath and needs to take a break after walking up about 12 stairs before continuing on.  He has not noticed any dyspnea on exertion while walking on flat surfaces.    Most recent fall risk assessment:    01/25/2023    8:32 AM  Fall Risk   Falls in the past year? 0  Number falls in past yr: 0  Injury with Fall? 0  Risk for fall due to : No Fall Risks     Most recent depression and anxiety screenings:    07/27/2023    9:10 AM 01/25/2023    8:31 AM  PHQ 2/9 Scores  PHQ - 2 Score 6 0  PHQ- 9 Score 17 5      07/27/2023    9:10 AM 01/25/2023    8:32 AM 07/26/2022    8:41 AM 01/23/2022    8:30 AM  GAD 7 : Generalized Anxiety Score  Nervous, Anxious, on Edge 1 0 0 0  Control/stop worrying 1 0 0 0  Worry too much - different things 1 1 0 1  Trouble relaxing 2 1 1  0  Restless 0 0 0 0  Easily annoyed or irritable 2 0 0 1  Afraid - awful might happen 0 0 0 0  Total GAD 7 Score 7 2 1 2   Anxiety Difficulty Not difficult at all Not difficult at all Not difficult at all Not difficult at all    Patient Active Problem List   Diagnosis Date Noted   Erectile dysfunction 01/23/2022   Prediabetes 01/23/2022   Bursitis of right hip  01/23/2021   Gastroesophageal reflux disease with esophagitis 01/19/2020   Cervical pain (neck) 07/28/2019   Nocturia 11/20/2017   Tinnitus of both ears 11/20/2017   Hyperlipidemia 11/20/2017   Essential hypertension 09/04/2017   Tobacco use 09/04/2017   Depression, recurrent (HCC) 09/04/2017    History reviewed. No pertinent surgical history. Social History   Tobacco Use   Smoking status: Every Day    Current packs/day: 1.00    Average packs/day: 1 pack/day for 20.0 years (20.0 ttl pk-yrs)    Types: Cigarettes    Passive exposure: Never   Smokeless tobacco: Never  Vaping Use   Vaping status: Every Day  Substance Use Topics   Alcohol use: No   Drug use: No   Family History  Problem Relation Age of Onset   Hypertension Mother    Diabetes Father    Alcohol abuse Father    Healthy Sister    No Known Allergies   Patient Care Team: Melida Quitter, PA as PCP - General (Family  Medicine)   Outpatient Medications Prior to Visit  Medication Sig   atorvastatin (LIPITOR) 40 MG tablet TAKE 1 TABLET BY MOUTH EVERY DAY AT 6 PM   citalopram (CELEXA) 40 MG tablet TAKE 1 TABLET BY MOUTH EVERY DAY   mometasone (ELOCON) 0.1 % cream Apply to aa's rash QD-BID up to 5d/wk.   tadalafil (CIALIS) 5 MG tablet Take 1 tablet (5 mg total) by mouth daily as needed for erectile dysfunction.   [DISCONTINUED] lisinopril (ZESTRIL) 10 MG tablet TAKE 1 TABLET BY MOUTH EVERY DAY   [DISCONTINUED] omeprazole (PRILOSEC) 20 MG capsule TAKE 1 CAPSULE BY MOUTH EVERY DAY   [DISCONTINUED] nicotine (NICODERM CQ) 21 mg/24hr patch Place 1 patch (21 mg total) onto the skin daily. (Patient not taking: Reported on 03/29/2023)   No facility-administered medications prior to visit.    Review of Systems  Constitutional:  Negative for chills, fever and malaise/fatigue.  HENT:  Negative for congestion and hearing loss.        Feels like fluid is leaking from ears nightly  Eyes:  Negative for blurred vision and  double vision.  Respiratory:  Positive for shortness of breath. Negative for cough.   Cardiovascular:  Negative for chest pain, palpitations and leg swelling.  Gastrointestinal:  Negative for abdominal pain, constipation, diarrhea and heartburn.  Genitourinary:  Negative for frequency and urgency.  Musculoskeletal:  Positive for joint pain (Left shoulder). Negative for myalgias and neck pain.  Neurological:  Negative for headaches.  Endo/Heme/Allergies:  Negative for polydipsia.  Psychiatric/Behavioral:  Negative for depression. The patient has insomnia. The patient is not nervous/anxious.       Objective:    BP (!) 146/94 (BP Location: Right Arm, Patient Position: Sitting, Cuff Size: Normal)   Pulse 71   Resp 18   Ht 5\' 8"  (1.727 m)   Wt 206 lb (93.4 kg)   SpO2 97%   BMI 31.32 kg/m    Physical Exam Constitutional:      General: He is not in acute distress.    Appearance: Normal appearance.  HENT:     Head: Normocephalic and atraumatic.     Right Ear: Tympanic membrane, ear canal and external ear normal. There is no impacted cerumen.     Left Ear: Tympanic membrane, ear canal and external ear normal. There is no impacted cerumen.     Nose: Nose normal.     Comments: Mild erythema    Mouth/Throat:     Mouth: Mucous membranes are moist.     Pharynx: Oropharynx is clear. No posterior oropharyngeal erythema.  Eyes:     Extraocular Movements: Extraocular movements intact.     Conjunctiva/sclera: Conjunctivae normal.     Pupils: Pupils are equal, round, and reactive to light.     Comments: Wearing glasses  Neck:     Thyroid: No thyroid mass, thyromegaly or thyroid tenderness.  Cardiovascular:     Rate and Rhythm: Normal rate and regular rhythm.     Heart sounds: Normal heart sounds. No murmur heard.    No friction rub. No gallop.  Pulmonary:     Effort: Pulmonary effort is normal. No respiratory distress.     Breath sounds: Normal breath sounds. No stridor. No wheezing or  rales.  Abdominal:     General: Bowel sounds are normal.     Palpations: Abdomen is soft. There is no mass.     Tenderness: There is no abdominal tenderness.  Musculoskeletal:     Cervical back: Normal range of  motion and neck supple.     Right lower leg: No edema.     Left lower leg: No edema.     Comments: Decreased range of motion of left shoulder, otherwise ROM normal for other joints  Lymphadenopathy:     Cervical: No cervical adenopathy.  Skin:    General: Skin is warm and dry.  Neurological:     Mental Status: He is alert and oriented to person, place, and time.     Cranial Nerves: No cranial nerve deficit.     Motor: No weakness.     Deep Tendon Reflexes: Reflexes normal.  Psychiatric:        Mood and Affect: Mood normal.        Assessment & Plan:    Routine Health Maintenance and Physical Exam  Immunization History  Administered Date(s) Administered   Influenza,inj,Quad PF,6+ Mos 10/08/2017, 12/16/2018, 07/28/2019, 07/30/2020, 07/25/2021, 07/13/2022   Influenza-Unspecified 07/18/2023   Janssen (J&J) SARS-COV-2 Vaccination 12/20/2019   Moderna Sars-Covid-2 Vaccination 08/14/2020, 02/11/2021   Tdap 12/16/2018   Zoster Recombinant(Shingrix) 07/28/2019, 01/19/2020    Health Maintenance  Topic Date Due   COVID-19 Vaccine (4 - 2023-24 season) 08/12/2023 (Originally 06/10/2023)   Lung Cancer Screening  02/01/2024   Colonoscopy  02/06/2025   DTaP/Tdap/Td (2 - Td or Tdap) 12/15/2028   INFLUENZA VACCINE  Completed   Hepatitis C Screening  Completed   HIV Screening  Completed   Zoster Vaccines- Shingrix  Completed   HPV VACCINES  Aged Out    Not fasting, will return for labs including CBC, CMP, lipid panel, A1C.  Discussed health benefits of physical activity, and encouraged him to engage in regular exercise appropriate for his age and condition.  Wellness examination  Essential hypertension Assessment & Plan: BP goal <130/80. Above goal, increase lisinopril to  20 mg daily.  Return in 6 weeks for follow-up and repeat CMP at that time.  Orders: -     CBC with Differential/Platelet; Future -     Comprehensive metabolic panel; Future -     Lisinopril; Take 1 tablet (20 mg total) by mouth daily.  Dispense: 90 tablet; Refill: 3  Mixed hyperlipidemia Assessment & Plan: Last lipid panel: LDL 64, HDL 42, triglycerides 70.  Continue atorvastatin 40 mg daily.  Recommend heart healthy diet low in fat and regular exercise.  Repeating lipid panel and CMP for hepatic function.  Will continue to monitor.  Orders: -     Lipid panel; Future  Prediabetes Assessment & Plan: Last A1c 6.4.  Recommend to be mindful of and limit intake of sugary foods and simple carbohydrates.  Will continue to monitor.  Orders: -     Hemoglobin A1c; Future  Depression, recurrent (HCC) Assessment & Plan: PHQ-9 score of 17, increased from baseline.  Not interested in changing medication right now, continue Celexa 40 mg daily and follow-up again at 6-week follow-up appointment.   Tobacco use -     CBC with Differential/Platelet; Future  Gastroesophageal reflux disease with esophagitis, unspecified whether hemorrhage -     Omeprazole; Take 1 capsule (20 mg total) by mouth daily.  Dispense: 90 capsule; Refill: 1  Dyspnea on exertion -     ECHOCARDIOGRAM COMPLETE; Future  Recommend trial of cetirizine daily to improve ear discomfort/feeling of fluid running out.  Increasing lisinopril for improved blood pressure control, also ordering echocardiogram for new dyspnea on exertion.  Will look into light therapy from [employer].   Return in about 6 weeks (around 09/07/2023) for  follow-up for HTN, morning appt.     Melida Quitter, PA

## 2023-07-27 NOTE — Assessment & Plan Note (Signed)
PHQ-9 score of 17, increased from baseline.  Not interested in changing medication right now, continue Celexa 40 mg daily and follow-up again at 6-week follow-up appointment.

## 2023-07-27 NOTE — Assessment & Plan Note (Signed)
BP goal <130/80. Above goal, increase lisinopril to 20 mg daily.  Return in 6 weeks for follow-up and repeat CMP at that time.

## 2023-08-01 ENCOUNTER — Encounter: Payer: Self-pay | Admitting: Family Medicine

## 2023-08-09 ENCOUNTER — Other Ambulatory Visit: Payer: Managed Care, Other (non HMO)

## 2023-08-09 DIAGNOSIS — I10 Essential (primary) hypertension: Secondary | ICD-10-CM

## 2023-08-09 DIAGNOSIS — Z720 Tobacco use: Secondary | ICD-10-CM

## 2023-08-09 DIAGNOSIS — E782 Mixed hyperlipidemia: Secondary | ICD-10-CM

## 2023-08-09 DIAGNOSIS — R7303 Prediabetes: Secondary | ICD-10-CM

## 2023-08-09 NOTE — Addendum Note (Signed)
Addended by: Lamonte Sakai, Thurmond Hildebran D on: 08/09/2023 10:13 AM   Modules accepted: Orders

## 2023-08-11 LAB — CBC WITH DIFFERENTIAL/PLATELET
Basophils Absolute: 0 10*3/uL (ref 0.0–0.2)
Basos: 1 %
EOS (ABSOLUTE): 0.2 10*3/uL (ref 0.0–0.4)
Eos: 3 %
Hematocrit: 43.6 % (ref 37.5–51.0)
Hemoglobin: 14 g/dL (ref 13.0–17.7)
Immature Grans (Abs): 0 10*3/uL (ref 0.0–0.1)
Immature Granulocytes: 0 %
Lymphocytes Absolute: 2.4 10*3/uL (ref 0.7–3.1)
Lymphs: 34 %
MCH: 24.5 pg — ABNORMAL LOW (ref 26.6–33.0)
MCHC: 32.1 g/dL (ref 31.5–35.7)
MCV: 76 fL — ABNORMAL LOW (ref 79–97)
Monocytes Absolute: 0.8 10*3/uL (ref 0.1–0.9)
Monocytes: 11 %
Neutrophils Absolute: 3.6 10*3/uL (ref 1.4–7.0)
Neutrophils: 51 %
Platelets: 315 10*3/uL (ref 150–450)
RBC: 5.71 x10E6/uL (ref 4.14–5.80)
RDW: 15.3 % (ref 11.6–15.4)
WBC: 7 10*3/uL (ref 3.4–10.8)

## 2023-08-11 LAB — COMPREHENSIVE METABOLIC PANEL
ALT: 22 [IU]/L (ref 0–44)
AST: 20 IU/L (ref 0–40)
Albumin: 4.2 g/dL (ref 3.9–4.9)
Alkaline Phosphatase: 98 IU/L (ref 44–121)
BUN/Creatinine Ratio: 8 — ABNORMAL LOW (ref 10–24)
BUN: 9 mg/dL (ref 8–27)
Bilirubin Total: <0.2 mg/dL (ref 0.0–1.2)
CO2: 23 mmol/L (ref 20–29)
Calcium: 9 mg/dL (ref 8.6–10.2)
Chloride: 107 mmol/L — ABNORMAL HIGH (ref 96–106)
Creatinine, Ser: 1.12 mg/dL (ref 0.76–1.27)
Globulin, Total: 2.3 g/dL (ref 1.5–4.5)
Glucose: 108 mg/dL — ABNORMAL HIGH (ref 70–99)
Potassium: 4.7 mmol/L (ref 3.5–5.2)
Sodium: 145 mmol/L — ABNORMAL HIGH (ref 134–144)
Total Protein: 6.5 g/dL (ref 6.0–8.5)
eGFR: 75 mL/min/{1.73_m2} (ref >59)

## 2023-08-11 LAB — HEMOGLOBIN A1C
Est. average glucose Bld gHb Est-mCnc: 140 mg/dL
Hgb A1c MFr Bld: 6.5 % — ABNORMAL HIGH (ref 4.8–5.6)

## 2023-08-11 LAB — LIPID PANEL
Chol/HDL Ratio: 2.9 ratio (ref 0.0–5.0)
Cholesterol, Total: 109 mg/dL (ref 100–199)
HDL: 38 mg/dL — ABNORMAL LOW (ref >39)
LDL Chol Calc (NIH): 59 mg/dL (ref 0–99)
Triglycerides: 53 mg/dL (ref 0–149)
VLDL Cholesterol Cal: 12 mg/dL (ref 5–40)

## 2023-08-17 ENCOUNTER — Ambulatory Visit (HOSPITAL_COMMUNITY): Admission: RE | Admit: 2023-08-17 | Payer: Managed Care, Other (non HMO) | Source: Ambulatory Visit

## 2023-09-13 ENCOUNTER — Ambulatory Visit: Payer: Managed Care, Other (non HMO) | Admitting: Family Medicine

## 2023-10-16 ENCOUNTER — Ambulatory Visit (HOSPITAL_COMMUNITY): Payer: Managed Care, Other (non HMO)

## 2023-10-17 ENCOUNTER — Ambulatory Visit: Payer: Managed Care, Other (non HMO) | Admitting: Family Medicine

## 2023-11-15 ENCOUNTER — Encounter: Payer: Self-pay | Admitting: Family Medicine

## 2024-04-03 ENCOUNTER — Encounter: Payer: Self-pay | Admitting: Neurology

## 2024-04-03 ENCOUNTER — Telehealth: Payer: Self-pay | Admitting: Neurology

## 2024-04-03 ENCOUNTER — Ambulatory Visit: Payer: Managed Care, Other (non HMO) | Admitting: Neurology

## 2024-04-03 VITALS — BP 126/86 | HR 80 | Resp 15 | Ht 68.0 in | Wt 193.3 lb

## 2024-04-03 DIAGNOSIS — R4189 Other symptoms and signs involving cognitive functions and awareness: Secondary | ICD-10-CM

## 2024-04-03 NOTE — Telephone Encounter (Signed)
 Referral  to Neuropsychology Faxed to [facility]   [facility] Phone# [phone] Fax#[phone]

## 2024-04-03 NOTE — Progress Notes (Signed)
 [facility]  PATIENT: Mario Newman DOB: 1961-09-20  REQUESTING CLINICIAN: Wallace Joesph LABOR, PA HISTORY FROM: Patient  REASON FOR VISIT: Memory loss    HISTORICAL  CHIEF COMPLAINT:  Chief Complaint  Patient presents with   Memory Loss    Rm12, alone, Memory loss: MOCA SCORE OF 27.    INTERVAL HISTORY 04/03/2024 Patient presents today for follow-up, last visit was a year ago.  At that time, we obtained a MRI brain which was normal, referred the patient for formal neuropsychological testing but has not been completed yet.  Will send a new referral.  He tells me that he feels that his memory is getting worse, mainly difficulty with attention.  He can get up and wanted to do 3 things but he will do 1 or 2 and forget about the third one.  This has been on bothering the patient.  Otherwise he is independent all his ADL, trying to quite smoking, he recognized that he has not been exercising as he wanted to.   HISTORY OF PRESENT ILLNESS:  This is a 63 year old gentleman past medical history of hypertension, hyperlipidemia, anxiety who is presenting with concern of memory loss for the past 8 months.  Patient reports for the past 8 months he has been having worsening memory, stated most of the time, he will go to a room and forget the reason he was there in the first place.  He is unable to keep focused on more than one task. It is difficult for him to multitask.  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  He is not remembering events like before.  Wife is concerned about ADD because both son have ADD and they exhibit the same behaviors sometimes like her husband.  He is independent all actives of daily living.  Wife reports that he is not repetitive and does not asked the same questions over and over.    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   Mood: Anxiety and depression Family history of Dementia: Denies  Functional status: independent in all ADLs and IADLs Patient lives with spouse and family . Cooking: no Cleaning: yes  Shopping: no Bathing: no issues  Toileting: no issues  Driving: no issues  Bills: wife  Medications: no issues Ever left the stove on by accident?: n/a Forget how to use items around the house?: denies  Getting lost going to familiar places?: denies  Forgetting loved ones names?: denies Word finding difficulty? Yes  Sleep: not good, does not sleep    OTHER MEDICAL CONDITIONS: Anxiety, Hypertension, Hyperlipidemia    REVIEW OF SYSTEMS: Full 14 system review of systems performed and negative with exception of: As noted in the HPI  ALLERGIES: No Known Allergies  HOME MEDICATIONS: Outpatient Medications Prior to Visit  Medication Sig Dispense Refill   atorvastatin  (LIPITOR) 40 MG tablet TAKE 1 TABLET BY MOUTH EVERY DAY AT 6 PM 90 tablet 1   citalopram  (CELEXA ) 40 MG tablet TAKE 1 TABLET BY MOUTH EVERY DAY 90 tablet 1   lisinopril  (ZESTRIL ) 20 MG tablet Take 1 tablet (20 mg total) by mouth daily. 90 tablet 3   mometasone  (ELOCON ) 0.1 % cream Apply to aa's rash QD-BID up to 5d/wk. 45 g 2   omeprazole  (PRILOSEC) 20 MG capsule Take 1 capsule (20  mg total) by mouth daily. 90 capsule 1   tadalafil  (CIALIS ) 5 MG tablet Take 1 tablet (5 mg total) by mouth daily as needed for erectile dysfunction. 30 tablet 0   No facility-administered medications prior to visit.    PAST MEDICAL HISTORY: Past Medical History:  Diagnosis Date   Hyperlipidemia    Hypertension     PAST SURGICAL HISTORY: History reviewed. No pertinent surgical history.  FAMILY HISTORY: Family History  Problem Relation Age of Onset   Hypertension Mother    Diabetes Father    Alcohol abuse Father    Healthy Sister     SOCIAL HISTORY: Social History   Socioeconomic History    Marital status: Married    Spouse name: Not on file   Number of children: Not on file   Years of education: Not on file   Highest education level: Not on file  Occupational History   Not on file  Tobacco Use   Smoking status: Every Day    Current packs/day: 1.00    Average packs/day: 1 pack/day for 20.0 years (20.0 ttl pk-yrs)    Types: Cigarettes    Passive exposure: Never   Smokeless tobacco: Never  Vaping Use   Vaping status: Every Day  Substance and Sexual Activity   Alcohol use: No   Drug use: No   Sexual activity: Yes    Birth control/protection: None  Other Topics Concern   Not on file  Social History Narrative   Right handed   Caffeine - 1 pot of coffee   Lives at home with Wife and adult children   Social Drivers of Health   Financial Resource Strain: Not on file  Food Insecurity: Not on file  Transportation Needs: Not on file  Physical Activity: Not on file  Stress: Not on file  Social Connections: Not on file  Intimate Partner Violence: Not on file    PHYSICAL EXAM  GENERAL EXAM/CONSTITUTIONAL: Vitals:  Vitals:   04/03/24 0959 04/03/24 1008  BP: (!) 143/89 126/86  Pulse: 80   Resp: 15   SpO2: 95%   Weight: 193 lb 4.8 oz (87.7 kg)   Height: 5' 8 (1.727 m)    Body mass index is 29.39 kg/m. Wt Readings from Last 3 Encounters:  04/03/24 193 lb 4.8 oz (87.7 kg)  07/27/23 206 lb (93.4 kg)  03/29/23 202 lb (91.6 kg)   Patient is in no distress; well developed, nourished and groomed; neck is supple  MUSCULOSKELETAL: Gait, strength, tone, movements noted in Neurologic exam below  NEUROLOGIC: MENTAL STATUS:      No data to display            04/03/2024   10:02 AM 03/29/2023    9:43 AM  Montreal Cognitive Assessment   Visuospatial/ Executive (0/5) 5 5  Naming (0/3) 3 3  Attention: Read list of digits (0/2) 2 2  Attention: Read list of letters (0/1) 1 1  Attention: Serial 7 subtraction starting at 100 (0/3) 3 3  Language: Repeat phrase  (0/2) 1 1  Language : Fluency (0/1) 1 1  Abstraction (0/2) 2 2  Delayed Recall (0/5) 3 3  Orientation (0/6) 6 6  Total 27 27     CRANIAL NERVE:  2nd, 3rd, 4th, 6th- visual fields full to confrontation, extraocular muscles intact, no nystagmus 5th - facial sensation symmetric 7th - facial strength symmetric 8th - hearing intact 9th - palate elevates symmetrically, uvula midline 11th - shoulder shrug symmetric 12th - tongue  protrusion midline  MOTOR:  normal bulk and tone, full strength in the BUE, BLE  SENSORY:  normal and symmetric to light touch  COORDINATION:  finger-nose-finger, fine finger movements normal  GAIT/STATION:  normal   DIAGNOSTIC DATA (LABS, IMAGING, TESTING) - I reviewed patient records, labs, notes, testing and imaging myself where available.  Lab Results  Component Value Date   WBC 7.0 08/09/2023   HGB 14.0 08/09/2023   HCT 43.6 08/09/2023   MCV 76 (L) 08/09/2023   PLT 315 08/09/2023      Component Value Date/Time   NA 145 (H) 08/09/2023 0827   K 4.7 08/09/2023 0827   CL 107 (H) 08/09/2023 0827   CO2 23 08/09/2023 0827   GLUCOSE 108 (H) 08/09/2023 0827   BUN 9 08/09/2023 0827   CREATININE 1.12 08/09/2023 0827   CALCIUM  9.0 08/09/2023 0827   PROT 6.5 08/09/2023 0827   ALBUMIN 4.2 08/09/2023 0827   AST 20 08/09/2023 0827   ALT 22 08/09/2023 0827   ALKPHOS 98 08/09/2023 0827   BILITOT <0.2 08/09/2023 0827   GFRNONAA 77 01/19/2020 0944   GFRAA 89 01/19/2020 0944   Lab Results  Component Value Date   CHOL 109 08/09/2023   HDL 38 (L) 08/09/2023   LDLCALC 59 08/09/2023   LDLDIRECT 56 07/28/2019   TRIG 53 08/09/2023   CHOLHDL 2.9 08/09/2023   Lab Results  Component Value Date   HGBA1C 6.5 (H) 08/09/2023   Lab Results  Component Value Date   VITAMINB12 527 02/21/2022   Lab Results  Component Value Date   TSH 0.732 08/01/2022   MRI Brain 04/05/2023 Unremarkable MRI brain (with and without). No acute findings.    ASSESSMENT  AND PLAN  63 y.o. year old male with hypertension, hyperlipidemia, anxiety who is presenting for follow-up for his memory complaint.  Again his exam is normal, MoCA score 27.  She is independent all actives of daily living.  I have informed patient that even though he does have some subjective complaint of memory loss and problem with attention, he has a normal cognition.  His most recent MRI brain was normal.  Will refer him to a formal neuropsychological testing for further evaluation.  Advised patient to contact me if he does not hear from the office regarding the neuropsychological testing.  He voiced understanding.  I will see him in 1 year for follow-up or sooner if worse.    1. Subjective memory complaints      Patient Instructions  Continue current medications  Please increase physical activity  Referral for formal neuropsychological testing, please contact me if you do not hear from the office Follow-up in 1 year or sooner if worse.   Orders Placed This Encounter  Procedures   Ambulatory referral to Neuropsychology    No orders of the defined types were placed in this encounter.   Return in about 1 year (around 04/03/2025).  I have spent a total of 45 minutes dedicated to this patient today, preparing to see patient, performing a medically appropriate examination and evaluation, ordering tests and/or medications and procedures, and counseling and educating the patient/family/caregiver; independently interpreting result and communicating results to the family/patient/caregiver; and documenting clinical information in the electronic medical record.    Pastor Falling, MD 04/03/2024, 1:16 PM  [facility] [address] [place], [place] [address] [phone]

## 2024-04-03 NOTE — Patient Instructions (Addendum)
 Continue current medications  Please increase physical activity  Referral for formal neuropsychological testing, please contact me if you do not hear from the office Follow-up in 1 year or sooner if worse.

## 2024-04-07 NOTE — Telephone Encounter (Signed)
 Received Fax from [employer]  stating   Unfortunate after several attempt to reach Pt regarding referral , We have been unsuccessful.Please advised Pt if wish to schedule  an appt to give us  a call  [facility]  - [phone]  Office Hours Mon-Fri  8:00 am to 5:00 pm

## 2024-04-24 NOTE — Telephone Encounter (Signed)
 Call Pt wife Pt wife was able to get Pt on Phone ,Informed PT to call [facility]  to scheudle appt  gave pt number to scheduled

## 2024-05-12 ENCOUNTER — Encounter: Payer: Self-pay | Admitting: Neurology

## 2024-05-15 ENCOUNTER — Other Ambulatory Visit: Payer: Self-pay | Admitting: Neurology

## 2024-05-15 DIAGNOSIS — R4189 Other symptoms and signs involving cognitive functions and awareness: Secondary | ICD-10-CM

## 2024-05-15 NOTE — Telephone Encounter (Signed)
 Referral  for Neuropsychology  faxed to [facility]  Psychiatric   [facility]  Psychiatric  Phone [phone] Fax [phone]

## 2024-05-15 NOTE — Telephone Encounter (Signed)
 Pt called back informed what referral will be sent to different , due to [facility] will not be able to see Pt at  this time .Informed Pt that we will give call once we faxed referral to different location .

## 2024-05-15 NOTE — Telephone Encounter (Signed)
 Called [facility] was informed they received Pt Referral ,however They can not see Pt due to not doing any memory lost testing at this time .

## 2024-05-15 NOTE — Telephone Encounter (Signed)
 Done. Thanks.

## 2024-05-21 NOTE — Telephone Encounter (Signed)
 Mario Newman from [facility]  called stating they can't see Pt due to Referral is for Neuropsychology .

## 2025-04-06 ENCOUNTER — Ambulatory Visit: Admitting: Neurology
